# Patient Record
Sex: Female | Born: 1985 | Race: Black or African American | Hispanic: No | Smoking: Former smoker
Health system: Southern US, Community
[De-identification: ages and names within clinical notes are randomized; demographics above are authoritative.]

## PROBLEM LIST (undated history)

## (undated) DIAGNOSIS — I10 Essential (primary) hypertension: Secondary | ICD-10-CM

## (undated) DIAGNOSIS — K219 Gastro-esophageal reflux disease without esophagitis: Secondary | ICD-10-CM

## (undated) DIAGNOSIS — J302 Other seasonal allergic rhinitis: Secondary | ICD-10-CM

## (undated) HISTORY — DX: Gastro-esophageal reflux disease without esophagitis: K21.9

## (undated) HISTORY — DX: Other seasonal allergic rhinitis: J30.2

## (undated) HISTORY — PX: NO PAST SURGERIES: SHX2092

---
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
## 2007-03-24 ENCOUNTER — Observation Stay: Payer: Self-pay

## 2007-03-25 ENCOUNTER — Inpatient Hospital Stay: Payer: Self-pay | Admitting: Obstetrics and Gynecology

## 2008-11-01 IMAGING — US US OB US >=[ID] SNGL FETUS
1 series · 17 of 28 positions shown · non-contrast
Comparison: none

REASON FOR EXAM: abdominal pain no fetal movement pt in rm # 7
COMMENTS:

[Series 1: us ob us >=(id) sngl fetus · 17 of 59 slices shown]
[im 1/59]
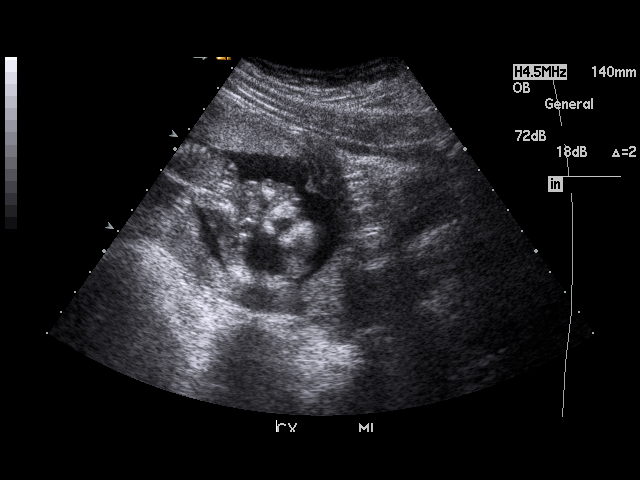
[im 5/59]
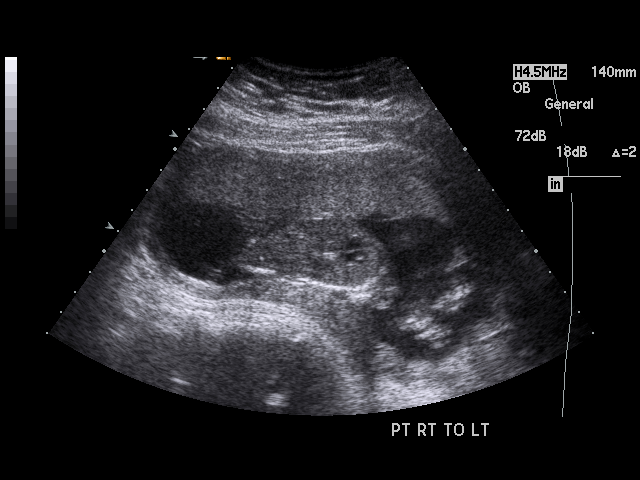
[im 9/59]
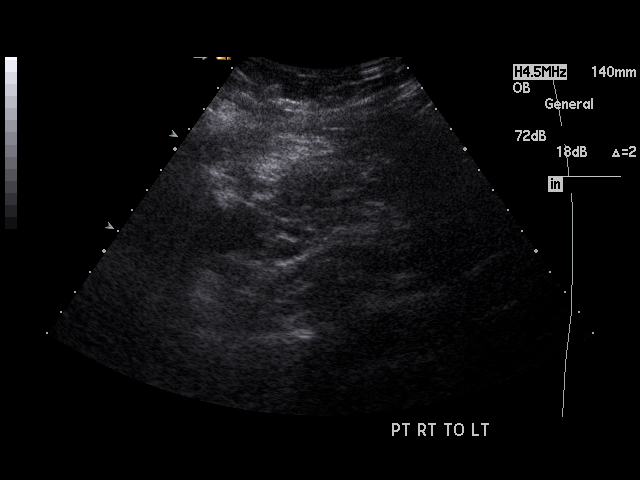
[im 11/59]
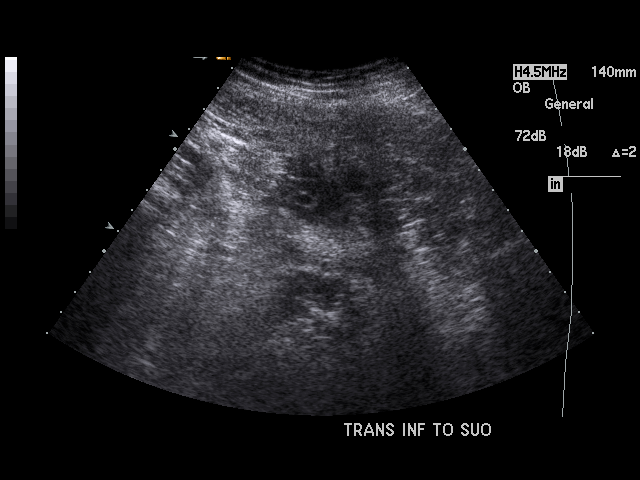
[im 16/59]
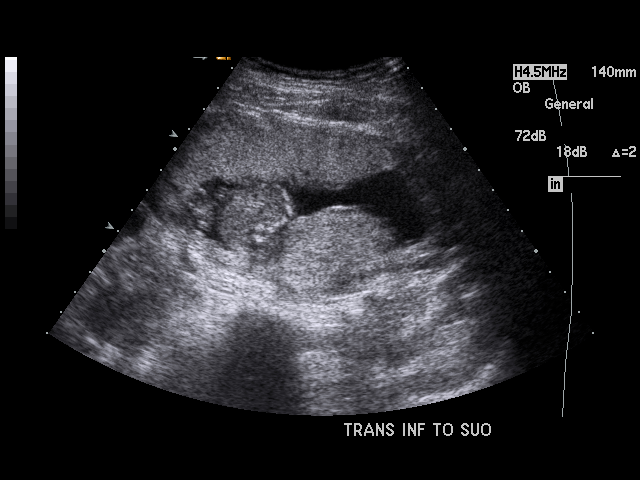
[im 20/59]
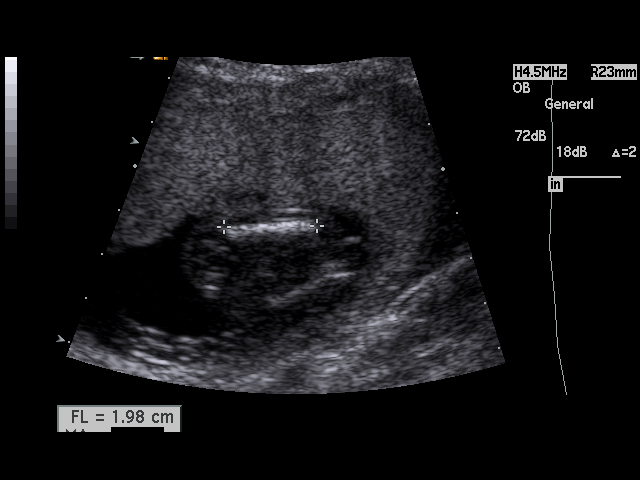
[im 22/59]
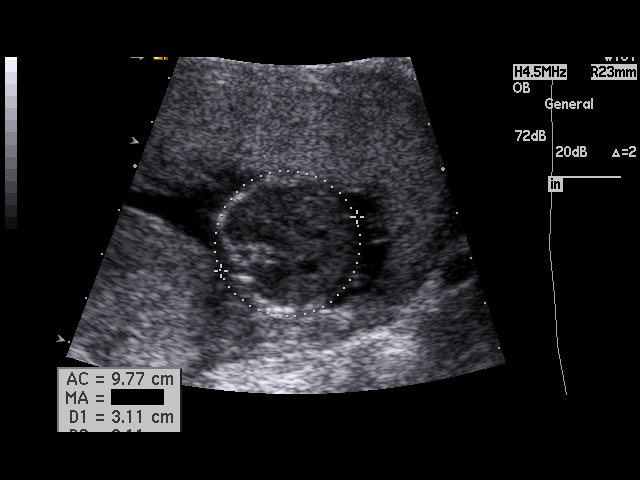
[im 26/59]
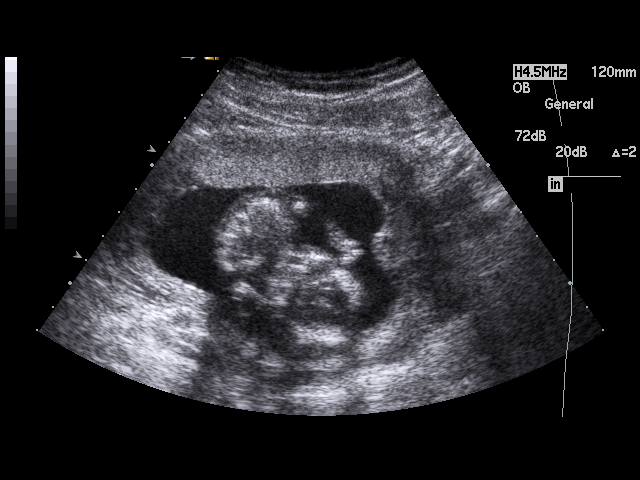
[im 31/59]
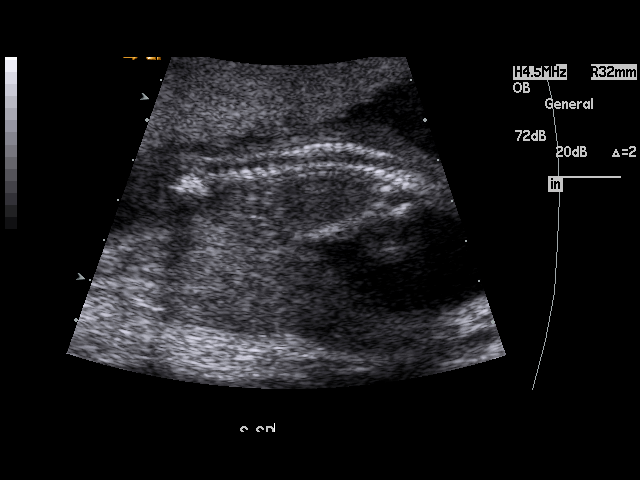
[im 33/59]
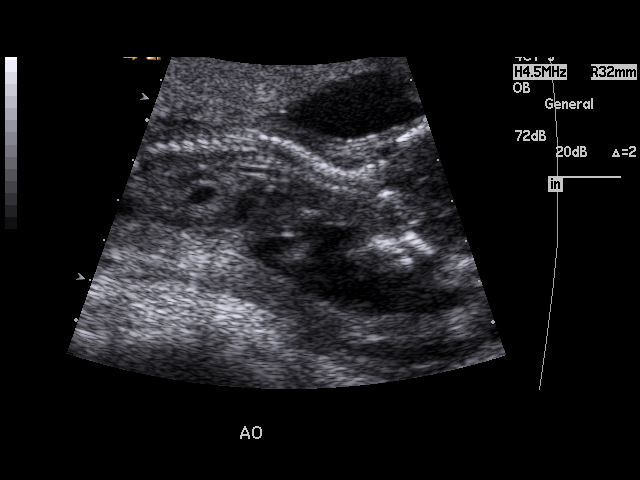
[im 37/59]
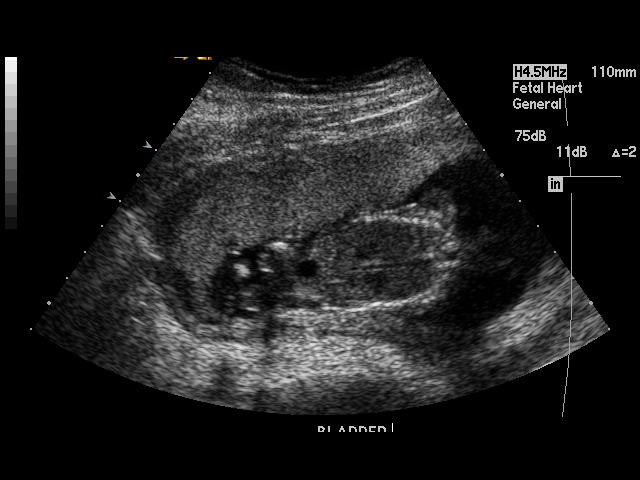
[im 39/59]
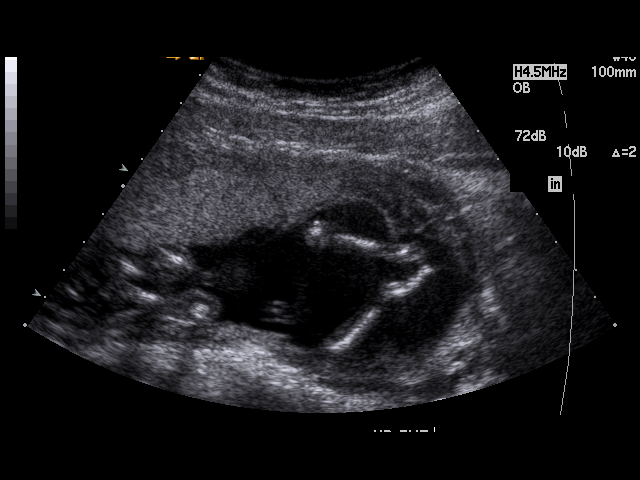
[im 43/59]
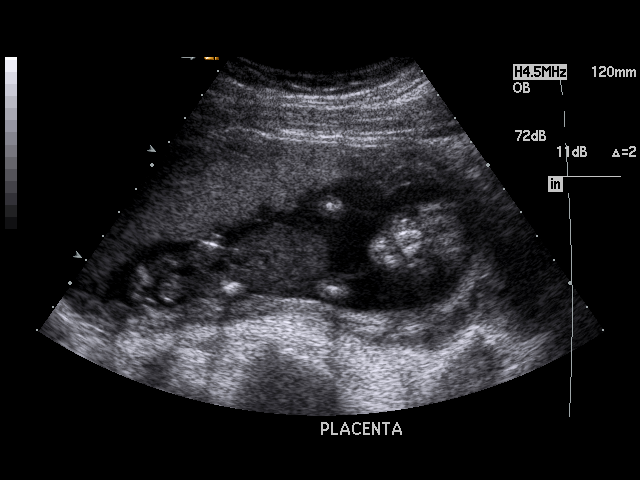
[im 48/59]
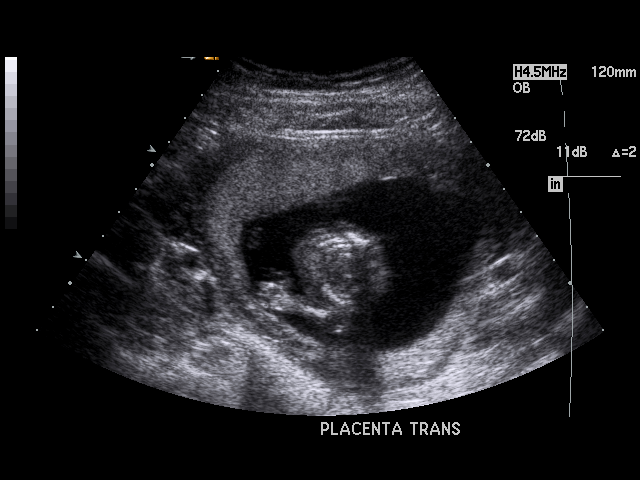
[im 50/59]
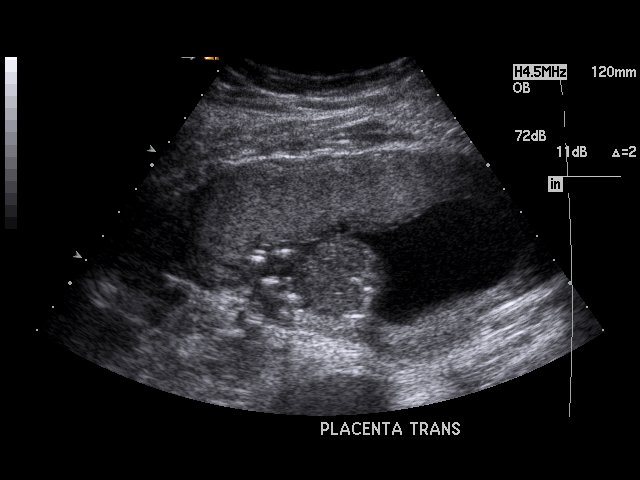
[im 54/59]
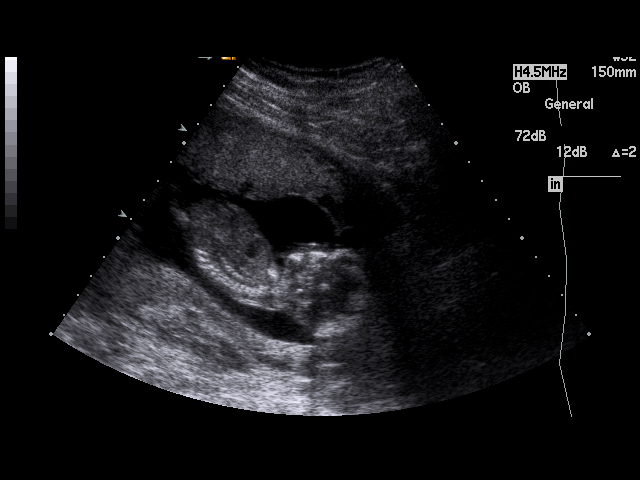
[im 59/59]
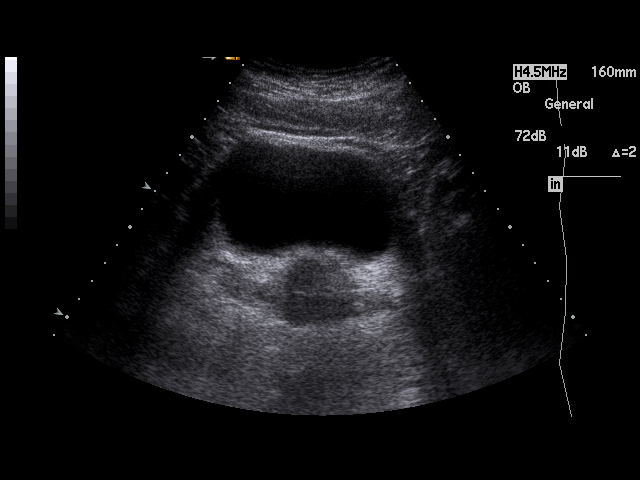

[17 of 28 positions shown; findings below may reference images not displayed]

PROCEDURE:     US  - US OB GREATER/OR EQUAL TO [id number]  - October 12, 2006  [DATE]

RESULT:     A single living intrauterine gestation is observed. Presentation
currently is cephalic. The placenta is anterior and high. Fetal heart rate
was monitored at 151 beats per minute. Fetal heart, stomach and urinary
bladder are visualized. No hydrocephalus or hydronephrosis is seen. Fetal
measurements are as follows:

BPD: 3.22 cm ([REDACTED] days)
AC: 9.83 cm (15 weeks-9 days)
FL: 2.00 cm ([REDACTED] days)

EFW = 158 grams. Average ultrasound age is [REDACTED]. Ultrasound EDD is
March 28, 2007.
IMPRESSION: 1. Single living intrauterine gestation of approximately [REDACTED]
gestational age.
2. Anterior placenta.
3. Amniotic fluid volume appears normal.

## 2018-08-24 ENCOUNTER — Other Ambulatory Visit: Payer: Self-pay

## 2018-08-24 ENCOUNTER — Encounter: Payer: Self-pay | Admitting: Certified Nurse Midwife

## 2018-08-24 ENCOUNTER — Ambulatory Visit (INDEPENDENT_AMBULATORY_CARE_PROVIDER_SITE_OTHER): Payer: BLUE CROSS/BLUE SHIELD | Admitting: Certified Nurse Midwife

## 2018-08-24 ENCOUNTER — Other Ambulatory Visit (HOSPITAL_COMMUNITY)
Admission: RE | Admit: 2018-08-24 | Discharge: 2018-08-24 | Disposition: A | Discharge status: Home / Self Care | Payer: BLUE CROSS/BLUE SHIELD | Source: Ambulatory Visit | Attending: Certified Nurse Midwife | Admitting: Certified Nurse Midwife

## 2018-08-24 VITALS — BP 116/76 | HR 88 | Ht 67.0 in | Wt 264.4 lb

## 2018-08-24 DIAGNOSIS — N898 Other specified noninflammatory disorders of vagina: Secondary | ICD-10-CM | POA: Insufficient documentation

## 2018-08-24 NOTE — Patient Instructions (Signed)
Vaginitis  Vaginitis is a condition in which the vaginal tissue swells and becomes red (inflamed). This condition is most often caused by a change in the normal balance of bacteria and yeast that live in the vagina. This change causes an overgrowth of certain bacteria or yeast, which causes the inflammation. There are different types of vaginitis, but the most common types are:   Bacterial vaginosis.   Yeast infection (candidiasis).   Trichomoniasis vaginitis. This is a sexually transmitted disease (STD).   Viral vaginitis.   Atrophic vaginitis.   Allergic vaginitis.  What are the causes?  The cause of this condition depends on the type of vaginitis. It can be caused by:   Bacteria (bacterial vaginosis).   Yeast, which is a fungus (yeast infection).   A parasite (trichomoniasis vaginitis).   A virus (viral vaginitis).   Low hormone levels (atrophic vaginitis). Low hormone levels can occur during pregnancy, breastfeeding, or after menopause.   Irritants, such as bubble baths, scented tampons, and feminine sprays (allergic vaginitis).  Other factors can change the normal balance of the yeast and bacteria that live in the vagina. These include:   Antibiotic medicines.   Poor hygiene.   Diaphragms, vaginal sponges, spermicides, birth control pills, and intrauterine devices (IUD).   Sex.   Infection.   Uncontrolled diabetes.   A weakened defense (immune) system.  What increases the risk?  This condition is more likely to develop in women who:   Smoke.   Use vaginal douches, scented tampons, or scented sanitary pads.   Wear tight-fitting pants.   Wear thong underwear.   Use oral birth control pills or an IUD.   Have sex without a condom.   Have multiple sex partners.   Have an STD.   Frequently use the spermicide nonoxynol-9.   Eat lots of foods high in sugar.   Have uncontrolled diabetes.   Have low estrogen levels.   Have a weakened immune system from an immune disorder or medical  treatment.   Are pregnant or breastfeeding.  What are the signs or symptoms?  Symptoms vary depending on the cause of the vaginitis. Common symptoms include:   Abnormal vaginal discharge.  ? The discharge is white, gray, or yellow with bacterial vaginosis.  ? The discharge is thick, white, and cheesy with a yeast infection.  ? The discharge is frothy and yellow or greenish with trichomoniasis.   A bad vaginal smell. The smell is fishy with bacterial vaginosis.   Vaginal itching, pain, or swelling.   Sex that is painful.   Pain or burning when urinating.  Sometimes there are no symptoms.  How is this diagnosed?  This condition is diagnosed based on your symptoms and medical history. A physical exam, including a pelvic exam, will also be done. You may also have other tests, including:   Tests to determine the pH level (acidity or alkalinity) of your vagina.   A whiff test, to assess the odor that results when a sample of your vaginal discharge is mixed with a potassium hydroxide solution.   Tests of vaginal fluid. A sample will be examined under a microscope.  How is this treated?  Treatment varies depending on the type of vaginitis you have. Your treatment may include:   Antibiotic creams or pills to treat bacterial vaginosis and trichomoniasis.   Antifungal medicines, such as vaginal creams or suppositories, to treat a yeast infection.   Medicine to ease discomfort if you have viral vaginitis. Your sexual partner   should also be treated.   Estrogen delivered in a cream, pill, suppository, or vaginal ring to treat atrophic vaginitis. If vaginal dryness occurs, lubricants and moisturizing creams may help. You may need to avoid scented soaps, sprays, or douches.   Stopping use of a product that is causing allergic vaginitis. Then using a vaginal cream to treat the symptoms.  Follow these instructions at home:  Lifestyle   Keep your genital area clean and dry. Avoid soap, and only rinse the area with  water.   Do not douche or use tampons until your health care provider says it is okay to do so. Use sanitary pads, if needed.   Do not have sex until your health care provider approves. When you can return to sex, practice safe sex and use condoms.   Wipe from front to back. This avoids the spread of bacteria from the rectum to the vagina.  General instructions   Take over-the-counter and prescription medicines only as told by your health care provider.   If you were prescribed an antibiotic medicine, take or use it as told by your health care provider. Do not stop taking or using the antibiotic even if you start to feel better.   Keep all follow-up visits as told by your health care provider. This is important.  How is this prevented?   Use mild, non-scented products. Do not use things that can irritate the vagina, such as fabric softeners. Avoid the following products if they are scented:  ? Feminine sprays.  ? Detergents.  ? Tampons.  ? Feminine hygiene products.  ? Soaps or bubble baths.   Let air reach your genital area.  ? Wear cotton underwear to reduce moisture buildup.  ? Avoid wearing underwear while you sleep.  ? Avoid wearing tight pants and underwear or nylons without a cotton panel.  ? Avoid wearing thong underwear.   Take off any wet clothing, such as bathing suits, as soon as possible.   Practice safe sex and use condoms.  Contact a health care provider if:   You have abdominal pain.   You have a fever.   You have symptoms that last for more than 2-3 days.  Get help right away if:   You have a fever and your symptoms suddenly get worse.  Summary   Vaginitis is a condition in which the vaginal tissue becomes inflamed.This condition is most often caused by a change in the normal balance of bacteria and yeast that live in the vagina.   Treatment varies depending on the type of vaginitis you have.   Do not douche, use tampons , or have sex until your health care provider approves. When  you can return to sex, practice safe sex and use condoms.  This information is not intended to replace advice given to you by your health care provider. Make sure you discuss any questions you have with your health care provider.  Document Released: 02/01/2007 Document Revised: 05/12/2016 Document Reviewed: 05/12/2016  Elsevier Interactive Patient Education  2019 [employer]

## 2018-08-24 NOTE — Progress Notes (Signed)
GYN ENCOUNTER NOTE  Subjective:       Brittney Lee is a 33 y.o. No obstetric history on file. female is here for gynecologic evaluation of the following issues:  1. History of recurrent BV. She states she noticed over the past year as her period frequency increased. She has also noticed after intercourse.     Gynecologic History Patient's last menstrual period was 07/25/2018 (approximate). Contraception: Nexplanon, 01/2016 Last Pap: 2017 or 2018. Results were: normal Last mammogram: n/a.   Obstetric History OB History  Gravida Para Term Preterm AB Living  2 2 2     2   SAB TAB Ectopic Multiple Live Births          2    # Outcome Date GA Lbr Len/2nd Weight Sex Delivery Anes PTL Lv  2 Term 2008   8 lb (3.629 kg) M Vag-Spont   LIV  1 Term 2005   7 lb (3.175 kg) F Vag-Spont   LIV    Past Medical History:  Diagnosis Date  . GERD (gastroesophageal reflux disease)   . Seasonal allergies     History reviewed. No pertinent surgical history.  Current Outpatient Medications on File Prior to Visit  Medication Sig Dispense Refill  . Cetirizine HCl 10 MG CAPS Take by mouth.    . fluticasone (FLONASE) 50 MCG/ACT nasal spray Place into the nose.    . ranitidine (ZANTAC) 150 MG tablet     . spironolactone (ALDACTONE) 50 MG tablet Take 50 mg by mouth daily.     No current facility-administered medications on file prior to visit.     No Known Allergies  Social History   Socioeconomic History  . Marital status: Single    Spouse name: Not on file  . Number of children: Not on file  . Years of education: Not on file  . Highest education level: Not on file  Occupational History  . Not on file  Social Needs  . Financial resource strain: Not on file  . Food insecurity:    Worry: Not on file    Inability: Not on file  . Transportation needs:    Medical: Not on file    Non-medical: Not on file  Tobacco Use  . Smoking status: Current Every Day Smoker  . Smokeless tobacco:  Never Used  Substance and Sexual Activity  . Alcohol use: Yes    Comment: occas  . Drug use: Not Currently  . Sexual activity: Yes    Birth control/protection: Implant  Lifestyle  . Physical activity:    Days per week: Not on file    Minutes per session: Not on file  . Stress: Not on file  Relationships  . Social connections:    Talks on phone: Not on file    Gets together: Not on file    Attends religious service: Not on file    Active member of club or organization: Not on file    Attends meetings of clubs or organizations: Not on file    Relationship status: Not on file  . Intimate partner violence:    Fear of current or ex partner: Not on file    Emotionally abused: Not on file    Physically abused: Not on file    Forced sexual activity: Not on file  Other Topics Concern  . Not on file  Social History Narrative  . Not on file    Family History  Problem Relation Age of Onset  . Diabetes Mother  The following portions of the patient's history were reviewed and updated as appropriate: allergies, current medications, past family history, past medical history, past social history, past surgical history and problem list.  Review of Systems Review of Systems - Negative except as mentioned in HPI Review of Systems - General ROS: negative for - chills, fatigue, fever, hot flashes, malaise or night sweats Hematological and Lymphatic ROS: negative for - bleeding problems or swollen lymph nodes Gastrointestinal ROS: negative for - abdominal pain, blood in stools, change in bowel habits and nausea/vomiting Musculoskeletal ROS: negative for - joint pain, muscle pain or muscular weakness Genito-Urinary ROS: negative for - change in menstrual cycle, dysmenorrhea, dyspareunia, dysuria, genital ulcers, hematuria, incontinence, irregular/heavy menses, nocturia or pelvic pain. Positive for increased vaginal infections.  Objective:   BP 116/76   Pulse 88   Ht 5\' 7"  (1.702 m)   Wt  264 lb 7 oz (119.9 kg)   LMP 07/25/2018 (Approximate)   BMI 41.42 kg/m  CONSTITUTIONAL: Well-developed, well-nourished, obese,female in no acute distress.  HENT:  Normocephalic, atraumatic.  NECK: Normal range of motion, supple  SKIN: Skin is warm and dry. No rash noted. Not diaphoretic. No erythema. No pallor. NEUROLGIC: Alert and oriented to person, place, and time.  PSYCHIATRIC: Normal mood and affect. Normal behavior. Normal judgment and thought content. CARDIOVASCULAR:Not Examined RESPIRATORY: Not Examined BREASTS: Not Examined ABDOMEN: Soft, non distended; Non tender.  No Organomegaly. PELVIC:  External Genitalia: Normal  BUS: Normal  Vagina: Normal, white thick discharge , no odor  Cervix: Normal MUSCULOSKELETAL: Normal range of motion. No tenderness.  No cyanosis, clubbing, or edema.   Assessment:   Increased Vaginal discharge Recurrent BV   Plan:   Vaginal swab today for BV, yeast, and STD per pt request. Discussed boric acid suppositories after intercourse and period or with antibiotic use. Pt verbalizes and agrees to plan. Discussed use of metronidazole gel for 4-6 months for recurrent BV. Will follow up with results. Return for annual exam prn.   Doreene BurkeAnnie Shontavia Mickel, CNM

## 2018-08-25 LAB — CERVICOVAGINAL ANCILLARY ONLY
Bacterial vaginitis: POSITIVE — AB
Candida vaginitis: NEGATIVE
Chlamydia: NEGATIVE
Neisseria Gonorrhea: NEGATIVE
Trichomonas: NEGATIVE

## 2018-08-26 ENCOUNTER — Other Ambulatory Visit: Payer: Self-pay | Admitting: Certified Nurse Midwife

## 2018-08-26 MED ORDER — METRONIDAZOLE 500 MG PO TABS: 500.0000 mg | ORAL_TABLET | Freq: Two times a day (BID) | ORAL | 0 refills | Status: AC

## 2018-08-26 NOTE — Progress Notes (Signed)
Swab positive for BV. Orders placed for treatment. Pt notified via my chart.   Doreene Burke, CNM

## 2020-03-25 LAB — OB RESULTS CONSOLE GC/CHLAMYDIA: Gonorrhea: NEGATIVE

## 2020-04-04 ENCOUNTER — Encounter: Payer: Self-pay | Admitting: Obstetrics and Gynecology

## 2020-04-04 ENCOUNTER — Ambulatory Visit (INDEPENDENT_AMBULATORY_CARE_PROVIDER_SITE_OTHER): Payer: Medicaid Other | Admitting: Obstetrics and Gynecology

## 2020-04-04 ENCOUNTER — Other Ambulatory Visit: Payer: Self-pay

## 2020-04-04 ENCOUNTER — Other Ambulatory Visit (HOSPITAL_COMMUNITY)
Admission: RE | Admit: 2020-04-04 | Discharge: 2020-04-04 | Disposition: A | Discharge status: Home / Self Care | Payer: BLUE CROSS/BLUE SHIELD | Source: Ambulatory Visit | Attending: Obstetrics and Gynecology | Admitting: Obstetrics and Gynecology

## 2020-04-04 ENCOUNTER — Other Ambulatory Visit: Payer: Self-pay | Admitting: Obstetrics and Gynecology

## 2020-04-04 VITALS — BP 140/84 | Wt 285.5 lb

## 2020-04-04 DIAGNOSIS — Z369 Encounter for antenatal screening, unspecified: Secondary | ICD-10-CM

## 2020-04-04 DIAGNOSIS — O0991 Supervision of high risk pregnancy, unspecified, first trimester: Secondary | ICD-10-CM

## 2020-04-04 DIAGNOSIS — Z113 Encounter for screening for infections with a predominantly sexual mode of transmission: Secondary | ICD-10-CM

## 2020-04-04 DIAGNOSIS — Z124 Encounter for screening for malignant neoplasm of cervix: Secondary | ICD-10-CM | POA: Diagnosis not present

## 2020-04-04 DIAGNOSIS — Z3A10 10 weeks gestation of pregnancy: Secondary | ICD-10-CM

## 2020-04-04 DIAGNOSIS — Z3689 Encounter for other specified antenatal screening: Secondary | ICD-10-CM

## 2020-04-04 DIAGNOSIS — O0990 Supervision of high risk pregnancy, unspecified, unspecified trimester: Secondary | ICD-10-CM

## 2020-04-04 LAB — POCT URINALYSIS DIPSTICK OB
Glucose, UA: NEGATIVE
POC,PROTEIN,UA: NEGATIVE

## 2020-04-04 LAB — OB RESULTS CONSOLE VARICELLA ZOSTER ANTIBODY, IGG: Varicella: IMMUNE

## 2020-04-04 LAB — POCT URINE PREGNANCY: Preg Test, Ur: POSITIVE — AB

## 2020-04-04 MED ORDER — AMOXICILLIN-POT CLAVULANATE 875-125 MG PO TABS: 1.0000 | ORAL_TABLET | Freq: Two times a day (BID) | ORAL | 0 refills | Status: AC

## 2020-04-04 NOTE — Progress Notes (Signed)
NOB - Need RX for sinus infection. RM 3

## 2020-04-04 NOTE — Progress Notes (Signed)
New Obstetric Patient H&P    Chief Complaint: "Desires prenatal care"   History of Present Illness: Patient is a 34 y.o. [id number] Not Hispanic or Latino female, presents with amenorrhea and positive home pregnancy test. Patient's last menstrual period was 01/24/2020 (exact date). and based on her  LMP, her EDD is Estimated Date of Delivery: 10/30/20 and her EGA is [redacted]w[redacted]d. Cycles are regular monthly.   Since her LMP she claims she has experienced some fatigue, breast tenderness, no significant nausea. She denies vaginal bleeding. Her past medical history is noncontributory. Her prior pregnancies are notable for none  Since her LMP, she admits to the use of tobacco products  no There are cats in the home in the home  yes If yes Indoor She admits close contact with children on a regular basis  yes (15 year old son 43 year old daughter) She has had chicken pox in the past yes She has had Tuberculosis exposures, symptoms, or previously tested positive for TB   no Current or past history of domestic violence. no  Genetic Screening/Teratology Counseling: (Includes patient, baby's father, or anyone in either family with:)   1. Patient's age >/= 83 at [facility]  no 2. Thalassemia ([place], [place], Mediterranean, or Asian background): MCV<80  no 3. Neural tube defect (meningomyelocele, spina bifida, anencephaly)  no 4. Congenital heart defect  no  5. Down syndrome  no 6. Tay-Sachs (Jewish, [place]  no 7. Canavan's Disease  no 8. Sickle cell disease or trait (African)  no  9. Hemophilia or other blood disorders  no  10. Muscular dystrophy  no  11. Cystic fibrosis  no  12. Huntington's Chorea  no  13. Mental retardation/autism  no 14. Other inherited genetic or chromosomal disorder  no 15. Maternal metabolic disorder (DM, PKU, etc)  no 16. Patient or FOB with a child with a birth defect not listed above no  16a. Patient or FOB with a birth defect themselves no 17. Recurrent pregnancy loss,  or stillbirth  no  18. Any medications since LMP other than prenatal vitamins (include vitamins, supplements, OTC meds, drugs, alcohol)  no 19. Any other genetic/environmental exposure to discuss  no  Infection History:   1. Lives with someone with TB or TB exposed  no  2. Patient or partner has history of genital herpes  no 3. Rash or viral illness since LMP  no 4. History of STI (GC, CT, HPV, syphilis, HIV)  yes 5. History of recent travel :  no  Other pertinent information:  no     Review of Systems:10 point review of systems negative unless otherwise noted in HPI  Past Medical History:  There are no problems to display for this patient.   Past Surgical History:  No past surgical history on file.  Gynecologic History: Patient's last menstrual period was 01/24/2020 (exact date).  Obstetric History: [id number]  Family History:  Family History  Problem Relation Age of Onset  . Diabetes Mother     Social History:  Social History   Socioeconomic History  . Marital status: Single    Spouse name: Not on file  . Number of children: Not on file  . Years of education: Not on file  . Highest education level: Not on file  Occupational History  . Not on file  Tobacco Use  . Smoking status: Current Every Day Smoker  . Smokeless tobacco: Never Used  Substance and Sexual Activity  . Alcohol use: Yes  Comment: occas  . Drug use: Not Currently  . Sexual activity: Yes    Birth control/protection: Implant  Other Topics Concern  . Not on file  Social History Narrative  . Not on file   Social Determinants of Health   Financial Resource Strain: Not on file  Food Insecurity: Not on file  Transportation Needs: Not on file  Physical Activity: Not on file  Stress: Not on file  Social Connections: Not on file  Intimate Partner Violence: Not on file    Allergies:  No Known Allergies  Medications: Prior to Admission medications   Medication Sig Start Date End Date  Taking? Authorizing Provider  Cetirizine HCl 10 MG CAPS Take by mouth.   Yes [provider]  clindamycin (CLEOCIN T) 1 % lotion Apply topically to acne lesions in morning 03/27/20  Yes [provider]  fluticasone (FLONASE) 50 MCG/ACT nasal spray Place into the nose.   Yes [provider]  Prenatal Vit-Fe Fumarate-FA (PREPLUS) 27-1 MG TABS  01/16/20  Yes [provider]  ranitidine (ZANTAC) 150 MG tablet  07/29/17   [provider]  spironolactone (ALDACTONE) 50 MG tablet Take 50 mg by mouth daily.    [provider]    Physical Exam Vitals: Blood pressure 140/84, weight 285 lb 8 oz (129.5 kg), last menstrual period 01/24/2020. Body mass index is 44.72 kg/m.  General: NAD HEENT: normocephalic, anicteric Thyroid: no enlargement, no palpable nodules Pulmonary: No increased work of breathing, CTAB Cardiovascular: RRR, distal pulses 2+ Abdomen: NABS, soft, non-tender, non-distended.  Umbilicus without lesions.  No hepatomegaly, splenomegaly or masses palpable. No evidence of hernia  Genitourinary:  External: Normal external female genitalia.  Normal urethral meatus, normal  Bartholin's and Skene's glands.    Vagina: Normal vaginal mucosa, no evidence of prolapse.    Cervix: Grossly normal in appearance, no bleeding  Uterus: 10 week size, mobile, normal contour.  No CMT  Adnexa: ovaries non-enlarged, no adnexal masses  Rectal: deferred Extremities: no edema, erythema, or tenderness Neurologic: Grossly intact Psychiatric: mood appropriate, affect full   Assessment: 34 y.o. G3P2002 at [redacted]w[redacted]d presenting to initiate prenatal care  Plan: 1) Avoid alcoholic beverages. 2) Patient encouraged not to smoke.  3) Discontinue the use of all non-medicinal drugs and chemicals.  4) Take prenatal vitamins daily.  5) Nutrition, food safety (fish, cheese advisories, and high nitrite foods) and exercise discussed. 6) Hospital and practice style  discussed with cross coverage system.  7) Sinusitis - covid vaccinated, Rx augmentin 8) Dating can ordered  Vena [place], MD, [facility], [facility] 04/04/2020, 3:44 PM

## 2020-04-06 LAB — RPR+RH+ABO+RUB AB+AB SCR+CB...
Antibody Screen: NEGATIVE
HIV Screen 4th Generation wRfx: NONREACTIVE
Hematocrit: 38.4 % (ref 34.0–46.6)
Hemoglobin: 12.9 g/dL (ref 11.1–15.9)
Hepatitis B Surface Ag: NEGATIVE
MCH: 27.7 pg (ref 26.6–33.0)
MCHC: 33.6 g/dL (ref 31.5–35.7)
MCV: 82 fL (ref 79–97)
Platelets: 349 10*3/uL (ref 150–450)
RBC: 4.66 x10E6/uL (ref 3.77–5.28)
RDW: 14 % (ref 11.7–15.4)
RPR Ser Ql: NONREACTIVE
Rh Factor: POSITIVE
Rubella Antibodies, IGG: 14.5 index (ref >0.99)
Varicella zoster IgG: >4000 index (ref >165)
WBC: 7.4 10*3/uL (ref 3.4–10.8)

## 2020-04-07 LAB — URINE CULTURE

## 2020-04-09 LAB — CYTOLOGY - PAP
Chlamydia: NEGATIVE
Comment: NEGATIVE
Comment: NEGATIVE
Comment: NORMAL
Diagnosis: UNDETERMINED — AB
High risk HPV: NEGATIVE
Neisseria Gonorrhea: NEGATIVE

## 2020-04-20 NOTE — L&D Delivery Note (Signed)
MD Bjorn Pippin at bedside to talk at length with pt about options. Pt given several options by MD, and pt decided to go home and take 60 mg of her BP medication. Pt aware of all risks and benefits at this time and pt being DC ambulatory with steady gait.

## 2020-04-22 ENCOUNTER — Ambulatory Visit (INDEPENDENT_AMBULATORY_CARE_PROVIDER_SITE_OTHER): Payer: BC Managed Care – PPO

## 2020-04-22 ENCOUNTER — Other Ambulatory Visit: Payer: Self-pay

## 2020-04-22 ENCOUNTER — Ambulatory Visit (INDEPENDENT_AMBULATORY_CARE_PROVIDER_SITE_OTHER): Payer: Self-pay | Admitting: Obstetrics and Gynecology

## 2020-04-22 VITALS — BP 126/82 | Wt 284.0 lb

## 2020-04-22 DIAGNOSIS — Z3A12 12 weeks gestation of pregnancy: Secondary | ICD-10-CM | POA: Diagnosis not present

## 2020-04-22 DIAGNOSIS — Z3689 Encounter for other specified antenatal screening: Secondary | ICD-10-CM

## 2020-04-22 DIAGNOSIS — O0991 Supervision of high risk pregnancy, unspecified, first trimester: Secondary | ICD-10-CM

## 2020-04-22 DIAGNOSIS — O99210 Obesity complicating pregnancy, unspecified trimester: Secondary | ICD-10-CM

## 2020-04-22 DIAGNOSIS — Z369 Encounter for antenatal screening, unspecified: Secondary | ICD-10-CM

## 2020-04-22 DIAGNOSIS — Z31438 Encounter for other genetic testing of female for procreative management: Secondary | ICD-10-CM

## 2020-04-22 DIAGNOSIS — O0990 Supervision of high risk pregnancy, unspecified, unspecified trimester: Secondary | ICD-10-CM

## 2020-04-22 DIAGNOSIS — Z1379 Encounter for other screening for genetic and chromosomal anomalies: Secondary | ICD-10-CM

## 2020-04-22 MED ORDER — TERCONAZOLE 0.4 % VA CREA: 1.0000 | TOPICAL_CREAM | Freq: Every day | VAGINAL | 1 refills | Status: DC

## 2020-04-22 NOTE — Progress Notes (Signed)
Routine Prenatal Care Visit  Subjective  Brittney Lee is a 35 y.o. G3P2002 at [redacted]w[redacted]d being seen today for ongoing prenatal care.  She is currently monitored for the following issues for this high-risk pregnancy and has Supervision of high risk pregnancy, antepartum and Maternal obesity, antepartum on their problem list.  ----------------------------------------------------------------------------------- Patient reports no complaints.  Did have stomach flu about a week ago 24-hrs.  Sinusitis improved with Augmentin but candida symptoms now Contractions: Not present. Vag. Bleeding: None.  Movement: Absent. Denies leaking of fluid.  ----------------------------------------------------------------------------------- The following portions of the patient's history were reviewed and updated as appropriate: allergies, current medications, past family history, past medical history, past social history, past surgical history and problem list. Problem list updated.   Objective  Blood pressure 126/82, weight 284 lb (128.8 kg), last menstrual period 01/24/2020. Pregravid weight 273 lb (123.8 kg) Total Weight Gain 11 lb (4.99 kg) Urinalysis:      Fetal Status: Fetal Heart Rate (bpm): 171   Movement: Absent     General:  Alert, oriented and cooperative. Patient is in no acute distress.  Skin: Skin is warm and dry. No rash noted.   Cardiovascular: Normal heart rate noted  Respiratory: Normal respiratory effort, no problems with respiration noted  Abdomen: Soft, gravid, appropriate for gestational age. Pain/Pressure: Absent     Pelvic:  Cervical exam deferred        Extremities: Normal range of motion.     ental Status: Normal mood and affect. Normal behavior. Normal judgment and thought content.   US OB Comp Less 14 Wks  Result Date: 04/22/2020 Patient Name: Brittney Lee DOB: March 03, 1986 MRN: [id number] ULTRASOUND REPORT Location: [facility] Date of Service: 04/22/2020 Indications:dating  Findings: Mason Jim intrauterine pregnancy is visualized with a CRL consistent with [redacted]w[redacted]d gestation, giving an (U/S) EDD of 10/29/2020. The (U/S) EDD is consistent with the clinically established EDD of 10/30/2020. FHR: 171 BPM CRL measurement: 65.1 mm Yolk sac is questionably visualized. Amnion: visualized and appears normal Right Ovary is normal in appearance. Left Ovary is normal appearance. Corpus luteal cyst:  Right ovary Survey of the adnexa demonstrates no adnexal masses. There is no free peritoneal fluid in the cul de sac. Impression: 1. [redacted]w[redacted]d Viable Singleton Intrauterine pregnancy by U/S. 2. (U/S) EDD is consistent with Clinically established EDD of 10/30/2020, [redacted]w[redacted]d. Recommendations: 1.Clinical correlation with the patient's History and Physical Exam. Deanna Artis, RT There is a viable singleton gestation.  The fetal biometry correlates with established dating. Detailed evaluation of the fetal anatomy is precluded by early gestational age.  It must be noted that a normal ultrasound particular at this early gestational age is unable to rule out fetal aneuploidy, risk of first trimester miscarriage, or anatomic birth defects. Vena [place], MD, [facility], [facility] 04/22/2020, 3:21 PM     Assessment   34 y.o. G3O7564 at [redacted]w[redacted]d by  10/30/2020, by Last Menstrual Period presenting for routine prenatal visit  Plan   pregnancy 3 Problems (from 04/04/20 to present)    Problem Noted Resolved   Supervision of high risk pregnancy, antepartum 04/22/2020 by Vena [place], MD No   Overview Signed 04/22/2020  4:05 PM by Vena [place], MD    [facility]inic [facility] Prenatal Labs  Dating LMP = 12 week [place] Blood type: AB/Positive/-- (12/16 1637)   Genetic Screen 1 Screen:    AFP:     Quad:     NIPS: Antibody:Negative (12/16 1637)  Anatomic [place]  Rubella: 14.50 (12/16 1637) Varicella: Immune  GTT Early:               Third trimester:  RPR: Non Reactive (12/16 1637)   Rhogam N/A  HBsAg: Negative (12/16 1637)   TDaP vaccine                       Flu Shot: HIV: Non Reactive (12/16 1637)   Baby Food                                GBS:   Contraception  Pap: ASCUS HPV negative 04/04/2020  CBB     CS/VBAC    Support Person                Gestational age appropriate obstetric precautions including but not limited to vaginal bleeding, contractions, leaking of fluid and fetal movement were reviewed in detail with the patient.    Return in about 4 weeks (around 05/20/2020) for ROB and early 1-hr glucose.  Vena [place], MD, [facility], [facility] 04/22/2020, 4:07 PM

## 2020-04-27 LAB — MATERNIT 21 PLUS CORE, BLOOD
Fetal Fraction: 8
Result (T21): NEGATIVE
Trisomy 13 (Patau syndrome): NEGATIVE
Trisomy 18 (Edwards syndrome): NEGATIVE
Trisomy 21 (Down syndrome): NEGATIVE

## 2020-05-03 LAB — INHERITEST CORE(CF97,SMA,FRAX)

## 2020-05-09 ENCOUNTER — Other Ambulatory Visit: Payer: Self-pay

## 2020-05-09 DIAGNOSIS — O0990 Supervision of high risk pregnancy, unspecified, unspecified trimester: Secondary | ICD-10-CM

## 2020-05-09 DIAGNOSIS — O99210 Obesity complicating pregnancy, unspecified trimester: Secondary | ICD-10-CM

## 2020-05-21 ENCOUNTER — Other Ambulatory Visit: Payer: Self-pay

## 2020-05-21 ENCOUNTER — Ambulatory Visit (INDEPENDENT_AMBULATORY_CARE_PROVIDER_SITE_OTHER): Payer: Medicaid Other | Admitting: Advanced Practice Midwife

## 2020-05-21 ENCOUNTER — Other Ambulatory Visit: Payer: Medicaid Other

## 2020-05-21 ENCOUNTER — Encounter: Payer: Self-pay | Admitting: Advanced Practice Midwife

## 2020-05-21 VITALS — BP 128/80 | Wt 284.0 lb

## 2020-05-21 DIAGNOSIS — O99210 Obesity complicating pregnancy, unspecified trimester: Secondary | ICD-10-CM

## 2020-05-21 DIAGNOSIS — O0992 Supervision of high risk pregnancy, unspecified, second trimester: Secondary | ICD-10-CM

## 2020-05-21 DIAGNOSIS — Z3A16 16 weeks gestation of pregnancy: Secondary | ICD-10-CM

## 2020-05-21 NOTE — Progress Notes (Signed)
  Routine Prenatal Care Visit  Subjective  JAYDEN RUDGE is a 35 y.o. G3P2002 at [redacted]w[redacted]d being seen today for ongoing prenatal care.  She is currently monitored for the following issues for this high-risk pregnancy and has Supervision of high risk pregnancy, antepartum and Maternal obesity, antepartum on their problem list.  ----------------------------------------------------------------------------------- Patient reports no complaints.  States she didn't know she was having a glucose test today and she ate a fast food burger before getting here.  Contractions: Not present. Vag. Bleeding: None.  Movement: Present. Leaking Fluid denies.  ----------------------------------------------------------------------------------- The following portions of the patient's history were reviewed and updated as appropriate: allergies, current medications, past family history, past medical history, past social history, past surgical history and problem list. Problem list updated.  Objective  Blood pressure 128/80, weight 284 lb (128.8 kg), last menstrual period 01/24/2020. Pregravid weight 273 lb (123.8 kg) Total Weight Gain 11 lb (4.99 kg) Urinalysis: Urine Protein    Urine Glucose    Fetal Status: Fetal Heart Rate (bpm): 145   Movement: Present     General:  Alert, oriented and cooperative. Patient is in no acute distress.  Skin: Skin is warm and dry. No rash noted.   Cardiovascular: Normal heart rate noted  Respiratory: Normal respiratory effort, no problems with respiration noted  Abdomen: Soft, gravid, appropriate for gestational age. Pain/Pressure: Absent     Pelvic:  Cervical exam deferred        Extremities: Normal range of motion.     Mental Status: Normal mood and affect. Normal behavior. Normal judgment and thought content.   Assessment   35 y.o. [id number] at [redacted]w[redacted]d by  10/30/2020, by Last Menstrual Period presenting for routine prenatal visit  Plan   pregnancy 3 Problems (from 04/04/20 to  present)    Problem Noted Resolved   Supervision of high risk pregnancy, antepartum 04/22/2020 by Vena Austria, MD No   Overview Addendum 05/07/2020  8:54 AM by Vena Austria, MD    Clinic [facility] Prenatal Labs  Dating LMP = 12 week [place] Blood type: AB/Positive/-- (12/16 1637)   Genetic Screen NIPS: Normal XY Inheritest:Neg SMA, neg CF, neg Fragile-X Antibody:Negative (12/16 1637)  Anatomic [place]  Rubella: 14.50 (12/16 1637) Varicella: Immune  GTT Early:               Third trimester:  RPR: Non Reactive (12/16 1637)   Rhogam N/A HBsAg: Negative (12/16 1637)   TDaP vaccine                       Flu Shot: HIV: Non Reactive (12/16 1637)   Baby Food                                GBS:   Contraception  Pap: ASCUS HPV negative 04/04/2020  CBB   Pelvis tested to 8lbs  CS/VBAC N/A   Support Person            Previous Version       Preterm labor symptoms and general obstetric precautions including but not limited to vaginal bleeding, contractions, leaking of fluid and fetal movement were reviewed in detail with the patient. Weight gain during pregnancy: discussed healthy diet with increased physical activity for optimum weight gain.   Return in about 4 weeks (around 06/18/2020) for anatomy scan and rob.  Tresea Mall, CNM 05/21/2020 3:20 PM

## 2020-05-22 LAB — GLUCOSE TOLERANCE, 1 HOUR: Glucose, 1Hr PP: 173 mg/dL (ref 65–199)

## 2020-05-28 ENCOUNTER — Other Ambulatory Visit: Payer: Self-pay | Admitting: Obstetrics and Gynecology

## 2020-05-28 DIAGNOSIS — O0990 Supervision of high risk pregnancy, unspecified, unspecified trimester: Secondary | ICD-10-CM

## 2020-05-28 DIAGNOSIS — O99810 Abnormal glucose complicating pregnancy: Secondary | ICD-10-CM

## 2020-05-28 DIAGNOSIS — O99210 Obesity complicating pregnancy, unspecified trimester: Secondary | ICD-10-CM

## 2020-05-28 DIAGNOSIS — Z369 Encounter for antenatal screening, unspecified: Secondary | ICD-10-CM

## 2020-05-28 NOTE — Progress Notes (Signed)
Need 1-hr OGTT test in the next week order is in patient is aware

## 2020-06-05 ENCOUNTER — Other Ambulatory Visit: Payer: Medicaid Other

## 2020-06-05 ENCOUNTER — Other Ambulatory Visit: Payer: Self-pay

## 2020-06-05 DIAGNOSIS — O0990 Supervision of high risk pregnancy, unspecified, unspecified trimester: Secondary | ICD-10-CM

## 2020-06-05 DIAGNOSIS — Z369 Encounter for antenatal screening, unspecified: Secondary | ICD-10-CM

## 2020-06-05 DIAGNOSIS — O99210 Obesity complicating pregnancy, unspecified trimester: Secondary | ICD-10-CM

## 2020-06-05 DIAGNOSIS — O99810 Abnormal glucose complicating pregnancy: Secondary | ICD-10-CM

## 2020-06-06 LAB — GLUCOSE TOLERANCE, 1 HOUR: Glucose, 1Hr PP: 155 mg/dL (ref 65–199)

## 2020-06-13 ENCOUNTER — Other Ambulatory Visit: Payer: Self-pay | Admitting: Obstetrics and Gynecology

## 2020-06-13 ENCOUNTER — Telehealth: Payer: Self-pay

## 2020-06-13 DIAGNOSIS — O0990 Supervision of high risk pregnancy, unspecified, unspecified trimester: Secondary | ICD-10-CM

## 2020-06-13 DIAGNOSIS — O99210 Obesity complicating pregnancy, unspecified trimester: Secondary | ICD-10-CM

## 2020-06-13 DIAGNOSIS — O99810 Abnormal glucose complicating pregnancy: Secondary | ICD-10-CM

## 2020-06-13 NOTE — Telephone Encounter (Signed)
-----   Message from Vena Austria, MD sent at 06/13/2020 12:39 PM EST ----- Needs 3-hr OGTT if we can do it same time as her follow up visit next week and move her follow up to the morning.  Order is in patient aware

## 2020-06-13 NOTE — Progress Notes (Signed)
Needs 3-hr OGTT if we can do it same time as her follow up visit next week and move her follow up to the morning.  Order is in patient aware

## 2020-06-13 NOTE — Progress Notes (Signed)
1-hr still elevated discussed need to proceed with 3-hr OGTT with patient.

## 2020-06-13 NOTE — Telephone Encounter (Signed)
I called to schedule appointment for lab, I offered our next opening on 06/21/20. Patient said she would have to call back to scheduled due to work. Patient refused to scheduled at this time

## 2020-06-18 ENCOUNTER — Other Ambulatory Visit: Payer: Self-pay

## 2020-06-18 ENCOUNTER — Telehealth: Payer: Self-pay

## 2020-06-18 ENCOUNTER — Other Ambulatory Visit: Payer: Medicaid Other

## 2020-06-18 DIAGNOSIS — O99210 Obesity complicating pregnancy, unspecified trimester: Secondary | ICD-10-CM

## 2020-06-18 DIAGNOSIS — O0990 Supervision of high risk pregnancy, unspecified, unspecified trimester: Secondary | ICD-10-CM

## 2020-06-18 DIAGNOSIS — O99810 Abnormal glucose complicating pregnancy: Secondary | ICD-10-CM

## 2020-06-18 NOTE — Telephone Encounter (Signed)
Patient was scheduled today for 3 Hour Glucose.There was a failure of communication per patient when instructed to come back after drinking her glucose drink so the first draw wasn't able drawn correctly. .  Patient stated that she didn't want to retake to 3 hour glucose sent this is her third time having to take it. Patient is scheduled for u/s and follow up with you 06/19/20

## 2020-06-18 NOTE — Telephone Encounter (Signed)
Please advise 

## 2020-06-19 ENCOUNTER — Ambulatory Visit (INDEPENDENT_AMBULATORY_CARE_PROVIDER_SITE_OTHER): Payer: Medicaid Other

## 2020-06-19 ENCOUNTER — Encounter: Payer: Self-pay | Admitting: Advanced Practice Midwife

## 2020-06-19 ENCOUNTER — Ambulatory Visit (INDEPENDENT_AMBULATORY_CARE_PROVIDER_SITE_OTHER): Payer: BC Managed Care – PPO | Admitting: Advanced Practice Midwife

## 2020-06-19 VITALS — BP 126/84 | Wt 288.0 lb

## 2020-06-19 DIAGNOSIS — Z3A20 20 weeks gestation of pregnancy: Secondary | ICD-10-CM | POA: Diagnosis not present

## 2020-06-19 DIAGNOSIS — O99210 Obesity complicating pregnancy, unspecified trimester: Secondary | ICD-10-CM

## 2020-06-19 DIAGNOSIS — O0992 Supervision of high risk pregnancy, unspecified, second trimester: Secondary | ICD-10-CM

## 2020-06-19 DIAGNOSIS — Z3A21 21 weeks gestation of pregnancy: Secondary | ICD-10-CM

## 2020-06-19 DIAGNOSIS — Z369 Encounter for antenatal screening, unspecified: Secondary | ICD-10-CM

## 2020-06-19 NOTE — Progress Notes (Signed)
Anatomy scan today. No vb. No lof.  ?

## 2020-06-19 NOTE — Progress Notes (Signed)
Routine Prenatal Care Visit  Subjective  Brittney Lee is a 35 y.o. G3P2002 at [redacted]w[redacted]d being seen today for ongoing prenatal care.  She is currently monitored for the following issues for this high-risk pregnancy and has Supervision of high risk pregnancy, antepartum and Maternal obesity, antepartum on their problem list.  ----------------------------------------------------------------------------------- Patient reports no complaints.   Contractions: Not present. Vag. Bleeding: None.  Movement: Present. Leaking Fluid denies.  ----------------------------------------------------------------------------------- The following portions of the patient's history were reviewed and updated as appropriate: allergies, current medications, past family history, past medical history, past social history, past surgical history and problem list. Problem list updated.  Objective  Blood pressure 126/84, weight 288 lb (130.6 kg), last menstrual period 01/24/2020. Pregravid weight 273 lb (123.8 kg) Total Weight Gain 15 lb (6.804 kg) Urinalysis: Urine Protein    Urine Glucose    Fetal Status: Fetal Heart Rate (bpm): 159   Movement: Present      Patient Name: Brittney Lee DOB: 02-Mar-1986 MRN: [id number]  ULTRASOUND REPORT  Location: [facility] Date of Service: 06/19/2020   Indications:Anatomy Ultrasound Findings:  Mason Jim intrauterine pregnancy is visualized with FHR at 148 BPM.   Biometrics give an (U/S) Gestational age of [redacted]w[redacted]d and an (U/S) EDD of 10/31/20; this correlates with the clinically established Estimated Date of Delivery: 10/30/20   Fetal presentation is transverse with head maternal left. EFW: 374g (13oz). Placenta: anterior, unable to evaluate for previa due to contraction. Grade: 1 Cervix: unable to measure length due to contraction.  AFI: subjectively normal.  Anatomic survey is incomplete for spine due to transverse position; Gender - female.    Right Ovary: is  normal in appearance. Left Ovary: is normal appearance. Survey of the adnexa demonstrates no adnexal masses.  There is no free peritoneal fluid in the cul de sac.  Impression: 1. [redacted]w[redacted]d Viable Singleton Intrauterine pregnancy by U/S. 2. (U/S) EDD is consistent with Clinically established Estimated Date of Delivery: 10/30/20 . 3. Follow up cervical length with placenta location and spine views.  Recommendations: 1.Clinical correlation with the patient's History and Physical Exam.  Darlina Guys, RT   General:  Alert, oriented and cooperative. Patient is in no acute distress.  Skin: Skin is warm and dry. No rash noted.   Cardiovascular: Normal heart rate noted  Respiratory: Normal respiratory effort, no problems with respiration noted  Abdomen: Soft, gravid, appropriate for gestational age. Pain/Pressure: Absent     Pelvic:  Cervical exam deferred        Extremities: Normal range of motion.     Mental Status: Normal mood and affect. Normal behavior. Normal judgment and thought content.   Assessment   35 y.o. [id number] at [redacted]w[redacted]d by  10/30/2020, by Last Menstrual Period presenting for routine prenatal visit  Plan   pregnancy 3 Problems (from 04/04/20 to present)    Problem Noted Resolved   Supervision of high risk pregnancy, antepartum 04/22/2020 by Vena Austria, MD No   Overview Addendum 05/07/2020  8:54 AM by Vena Austria, MD    Clinic [facility] Prenatal Labs  Dating LMP = 12 week [place] Blood type: AB/Positive/-- (12/16 1637)   Genetic Screen NIPS: Normal XY Inheritest:Neg SMA, neg CF, neg Fragile-X Antibody:Negative (12/16 1637)  Anatomic [place]  Rubella: 14.50 (12/16 1637) Varicella: Immune  GTT Early:               Third trimester:  RPR: Non Reactive (12/16 1637)   Rhogam N/A HBsAg: Negative (12/16 1637)   TDaP vaccine  Flu Shot: HIV: Non Reactive (12/16 1637)   Baby Food                                GBS:   Contraception  Pap: ASCUS HPV negative  04/04/2020  CBB   Pelvis tested to 8lbs  CS/VBAC N/A   Support Person            Previous Version       Preterm labor symptoms and general obstetric precautions including but not limited to vaginal bleeding, contractions, leaking of fluid and fetal movement were reviewed in detail with the patient.   Return in about 4 weeks (around 07/17/2020) for follow up anatomy scan and rob.  Tresea Mall, CNM 06/19/2020 5:10 PM

## 2020-06-20 ENCOUNTER — Other Ambulatory Visit: Payer: Self-pay | Admitting: Advanced Practice Midwife

## 2020-06-20 DIAGNOSIS — R7309 Other abnormal glucose: Secondary | ICD-10-CM

## 2020-06-20 NOTE — Progress Notes (Signed)
Order placed for 3 hr gtt to be done at 3/29 visit. 2nd attempt.

## 2020-06-21 ENCOUNTER — Other Ambulatory Visit: Payer: Self-pay | Admitting: Obstetrics and Gynecology

## 2020-06-21 LAB — GESTATIONAL GLUCOSE TOLERANCE

## 2020-06-21 MED ORDER — ACCU-CHEK SOFTCLIX LANCETS MISC: 1.0000 | Freq: Four times a day (QID) | 11 refills | Status: DC

## 2020-06-21 MED ORDER — GLUCOSE BLOOD VI STRP: ORAL_STRIP | 11 refills | Status: DC

## 2020-06-21 MED ORDER — ACCU-CHEK AVIVA PLUS W/DEVICE KIT: 1.0000 [IU] | PACK | 0 refills | Status: DC

## 2020-07-16 ENCOUNTER — Encounter: Payer: Medicaid Other | Admitting: Obstetrics and Gynecology

## 2020-07-16 ENCOUNTER — Other Ambulatory Visit: Payer: Medicaid Other

## 2020-07-16 ENCOUNTER — Ambulatory Visit: Payer: Medicaid Other

## 2020-07-16 DIAGNOSIS — Z369 Encounter for antenatal screening, unspecified: Secondary | ICD-10-CM

## 2020-07-16 DIAGNOSIS — O0992 Supervision of high risk pregnancy, unspecified, second trimester: Secondary | ICD-10-CM

## 2020-07-25 ENCOUNTER — Other Ambulatory Visit: Payer: Medicaid Other

## 2020-07-25 ENCOUNTER — Ambulatory Visit (INDEPENDENT_AMBULATORY_CARE_PROVIDER_SITE_OTHER): Payer: Medicaid Other

## 2020-07-25 ENCOUNTER — Ambulatory Visit (INDEPENDENT_AMBULATORY_CARE_PROVIDER_SITE_OTHER): Payer: Medicaid Other | Admitting: Obstetrics and Gynecology

## 2020-07-25 ENCOUNTER — Other Ambulatory Visit: Payer: Self-pay

## 2020-07-25 VITALS — BP 126/72 | Wt 287.0 lb

## 2020-07-25 DIAGNOSIS — Z3A26 26 weeks gestation of pregnancy: Secondary | ICD-10-CM

## 2020-07-25 DIAGNOSIS — O0992 Supervision of high risk pregnancy, unspecified, second trimester: Secondary | ICD-10-CM

## 2020-07-25 DIAGNOSIS — O99210 Obesity complicating pregnancy, unspecified trimester: Secondary | ICD-10-CM

## 2020-07-25 DIAGNOSIS — Z113 Encounter for screening for infections with a predominantly sexual mode of transmission: Secondary | ICD-10-CM

## 2020-07-25 DIAGNOSIS — Z369 Encounter for antenatal screening, unspecified: Secondary | ICD-10-CM | POA: Diagnosis not present

## 2020-07-25 DIAGNOSIS — R7309 Other abnormal glucose: Secondary | ICD-10-CM

## 2020-07-25 DIAGNOSIS — O0990 Supervision of high risk pregnancy, unspecified, unspecified trimester: Secondary | ICD-10-CM

## 2020-07-25 LAB — POCT URINALYSIS DIPSTICK OB
Glucose, UA: NEGATIVE
POC,PROTEIN,UA: NEGATIVE

## 2020-07-25 MED ORDER — ESOMEPRAZOLE MAGNESIUM 20 MG PO CPDR: 20.0000 mg | DELAYED_RELEASE_CAPSULE | Freq: Every day | ORAL | 11 refills | Status: DC

## 2020-07-25 NOTE — Progress Notes (Signed)
Routine Prenatal Care Visit  Subjective  Brittney Lee is a 35 y.o. G3P2002 at [redacted]w[redacted]d being seen today for ongoing prenatal care.  She is currently monitored for the following issues for this high-risk pregnancy and has Supervision of high risk pregnancy, antepartum and Maternal obesity, antepartum on their problem list.  ----------------------------------------------------------------------------------- Patient reports persistent issues with sinusinitis, which she states she is currently "managing" with. Additionally, she reports dealing with heartburn daily. .   Contractions: Not present. Vag. Bleeding: None.  Movement: Present. Denies leaking of fluid.  ----------------------------------------------------------------------------------- The following portions of the patient's history were reviewed and updated as appropriate: allergies, current medications, past family history, past medical history, past social history, past surgical history and problem list. Problem list updated.   Objective  Blood pressure 126/72, weight 287 lb (130.2 kg), last menstrual period 01/24/2020. Pregravid weight 273 lb (123.8 kg) Total Weight Gain 14 lb (6.35 kg) Urinalysis:      Fetal Status: Fetal Heart Rate (bpm): 145 Fundal Height: 27 cm Movement: Present     General:  Alert, oriented and cooperative. Patient is in no acute distress.  Skin: Skin is warm and dry. No rash noted.   Cardiovascular: Normal heart rate noted  Respiratory: Normal respiratory effort, no problems with respiration noted  Abdomen: Soft, gravid, appropriate for gestational age. Pain/Pressure: Absent     Pelvic:  Cervical exam deferred        Extremities: Normal range of motion.     ental Status: Normal mood and affect. Normal behavior. Normal judgment and thought content.     Assessment   35 y.o. [id number] at [redacted]w[redacted]d by  10/30/2020, by Last Menstrual Period presenting for routine prenatal visit  Plan   pregnancy 3 Problems  (from 04/04/20 to present)    Problem Noted Resolved   Supervision of high risk pregnancy, antepartum 04/22/2020 by Vena Austria, MD No   Overview Addendum 07/26/2020 10:34 AM by Zipporah Plants, CNM     Nursing Staff Provider  Office Location  [facility] Dating   LMP = 12 week [place]  Language  English Anatomy US   complete, normal  Flu Vaccine   declined Genetic Screen  NIPS:  Normal XY  TDaP vaccine    Hgb A1C or  GTT Early: 1h: 173 - repeat 1h: 155;  Third trimester: 3h at 26w1 - 74/139/138/71  Rhogam   n/a   LAB RESULTS   Feeding Plan  formula Blood Type AB/Positive/-- (12/16 1637)   Contraception  Antibody Negative (12/16 1637)  Circumcision  Rubella 14.50 (12/16 1637)  Pediatrician   RPR Non Reactive (04/07 1238)   Support Person  Greig Castilla HBsAg Negative (12/16 1637)   Prenatal Classes  HIV Non Reactive (04/07 1238)    Varicella  immune  BTL Consent  GBS  (For PCN allergy, check sensitivities)        VBAC Consent  n/a Pap  ASCUS HPV negative 04/04/2020    Hgb Electro      CF      SMA               Previous Version      -Patient is completing her 3h GTT today - she had previously been unable to finish her first 3h GTT. We reviewed the screening process and what to anticipate if multiple values resulted as elevated. All questions answered.  -Nexium for heartburn.  -Third trimester labs were collected with her 3h GTT (CBC/RPR/HIV)  Second trimester precautions including but not  limited to vaginal bleeding, contractions, leaking of fluid and fetal movement were reviewed in detail with the patient.    Return in about 2 weeks (around 08/08/2020) for ROB - ROB with growth [place] in 4 weeks.  Zipporah Plants, CNM, MSN [facility], [facility] 07/26/2020, 10:35 AM      -nexium -formula Greig Castilla Reviewed [place]  Third tri labs

## 2020-07-26 LAB — CBC WITH DIFFERENTIAL/PLATELET
Basophils Absolute: 0 10*3/uL (ref 0.0–0.2)
Basos: 0 %
EOS (ABSOLUTE): 0.1 10*3/uL (ref 0.0–0.4)
Eos: 2 %
Hematocrit: 37.1 % (ref 34.0–46.6)
Hemoglobin: 11.9 g/dL (ref 11.1–15.9)
Immature Grans (Abs): 0 10*3/uL (ref 0.0–0.1)
Immature Granulocytes: 1 %
Lymphocytes Absolute: 2 10*3/uL (ref 0.7–3.1)
Lymphs: 29 %
MCH: 27.4 pg (ref 26.6–33.0)
MCHC: 32.1 g/dL (ref 31.5–35.7)
MCV: 85 fL (ref 79–97)
Monocytes Absolute: 0.5 10*3/uL (ref 0.1–0.9)
Monocytes: 8 %
Neutrophils Absolute: 4.3 10*3/uL (ref 1.4–7.0)
Neutrophils: 60 %
Platelets: 282 10*3/uL (ref 150–450)
RBC: 4.35 x10E6/uL (ref 3.77–5.28)
RDW: 13.4 % (ref 11.7–15.4)
WBC: 7 10*3/uL (ref 3.4–10.8)

## 2020-07-26 LAB — HIV ANTIBODY (ROUTINE TESTING W REFLEX): HIV Screen 4th Generation wRfx: NONREACTIVE

## 2020-07-26 LAB — GESTATIONAL GLUCOSE TOLERANCE
Glucose, Fasting: 74 mg/dL (ref 65–94)
Glucose, GTT - 1 Hour: 139 mg/dL (ref 65–179)
Glucose, GTT - 2 Hour: 138 mg/dL (ref 65–154)
Glucose, GTT - 3 Hour: 71 mg/dL (ref 65–139)

## 2020-07-26 LAB — RPR QUALITATIVE: RPR Ser Ql: NONREACTIVE

## 2020-08-08 ENCOUNTER — Other Ambulatory Visit: Payer: Self-pay

## 2020-08-08 ENCOUNTER — Ambulatory Visit (INDEPENDENT_AMBULATORY_CARE_PROVIDER_SITE_OTHER): Payer: BC Managed Care – PPO

## 2020-08-08 ENCOUNTER — Ambulatory Visit (INDEPENDENT_AMBULATORY_CARE_PROVIDER_SITE_OTHER): Payer: BC Managed Care – PPO | Admitting: Obstetrics and Gynecology

## 2020-08-08 ENCOUNTER — Encounter: Payer: Medicaid Other | Admitting: Obstetrics and Gynecology

## 2020-08-08 VITALS — BP 120/72 | Wt 293.0 lb

## 2020-08-08 DIAGNOSIS — Z3A27 27 weeks gestation of pregnancy: Secondary | ICD-10-CM

## 2020-08-08 DIAGNOSIS — Z3A28 28 weeks gestation of pregnancy: Secondary | ICD-10-CM

## 2020-08-08 DIAGNOSIS — O0990 Supervision of high risk pregnancy, unspecified, unspecified trimester: Secondary | ICD-10-CM

## 2020-08-08 DIAGNOSIS — Z7185 Encounter for immunization safety counseling: Secondary | ICD-10-CM

## 2020-08-08 DIAGNOSIS — O99210 Obesity complicating pregnancy, unspecified trimester: Secondary | ICD-10-CM

## 2020-08-08 DIAGNOSIS — Z23 Encounter for immunization: Secondary | ICD-10-CM

## 2020-08-08 LAB — POCT URINALYSIS DIPSTICK OB
Glucose, UA: NEGATIVE
POC,PROTEIN,UA: NEGATIVE

## 2020-08-08 NOTE — Addendum Note (Signed)
Addended by: Fortunato Curling R on: 08/08/2020 12:02 PM   Modules accepted: Orders

## 2020-08-08 NOTE — Progress Notes (Signed)
Routine Prenatal Care Visit  Subjective  Brittney Lee is a 35 y.o. G3P2002 at [redacted]w[redacted]d being seen today for ongoing prenatal care.  She is currently monitored for the following issues for this high-risk pregnancy and has Supervision of high risk pregnancy, antepartum and Maternal obesity, antepartum on their problem list.  ----------------------------------------------------------------------------------- Patient reports no complaints.   Contractions: Not present. Vag. Bleeding: None.  Movement: Present. Denies leaking of fluid.  ----------------------------------------------------------------------------------- The following portions of the patient's history were reviewed and updated as appropriate: allergies, current medications, past family history, past medical history, past social history, past surgical history and problem list. Problem list updated.   Objective  Blood pressure 120/72, weight 293 lb (132.9 kg), last menstrual period 01/24/2020. Pregravid weight 273 lb (123.8 kg) Total Weight Gain 20 lb (9.072 kg) Urinalysis:      Fetal Status: Fetal Heart Rate (bpm): 145 Fundal Height: 29 cm Movement: Present     General:  Alert, oriented and cooperative. Patient is in no acute distress.  Skin: Skin is warm and dry. No rash noted.   Cardiovascular: Normal heart rate noted  Respiratory: Normal respiratory effort, no problems with respiration noted  Abdomen: Soft, gravid, appropriate for gestational age. Pain/Pressure: Absent     Pelvic:  Cervical exam deferred        Extremities: Normal range of motion.     ental Status: Normal mood and affect. Normal behavior. Normal judgment and thought content.     Assessment   35 y.o. [id number] at [redacted]w[redacted]d by  10/30/2020, by Last Menstrual Period presenting for routine prenatal visit  Plan   pregnancy 3 Problems (from 04/04/20 to present)    Problem Noted Resolved   Supervision of high risk pregnancy, antepartum 04/22/2020 by Vena Austria, MD No   Overview Addendum 08/08/2020 11:13 AM by Zipporah Plants, CNM     Nursing Staff Provider  Office Location  [facility] Dating   LMP = 12 week [place]  Language  English Anatomy US   complete, normal  Flu Vaccine   declined Genetic Screen  NIPS:  Normal XY  TDaP vaccine   08/08/20 Hgb A1C or  GTT Early: 1h: 173 - repeat 1h: 155;  Third trimester: 3h at 26w1 - 74/139/138/71  Rhogam   n/a   LAB RESULTS   Feeding Plan  formula Blood Type AB/Positive/-- (12/16 1637)   Contraception  BTL Antibody Negative (12/16 1637)  Circumcision  Rubella 14.50 (12/16 1637)  Pediatrician   RPR Non Reactive (04/07 1238)   Support Person  Greig Castilla HBsAg Negative (12/16 1637)   Prenatal Classes  HIV Non Reactive (04/07 1238)    Varicella  immune  BTL Consent  08/08/20 GBS  (For PCN allergy, check sensitivities)        VBAC Consent  n/a Pap  ASCUS HPV negative 04/04/2020    Hgb Electro      CF      SMA               Previous Version      -BTL consent signed today -Tdap given today -Reviewed results from 3h GTT -Reviewed growth [place] from today - order placed for growth in 4-6 weeks  Preterm labor precautions including but not limited to vaginal bleeding, contractions, leaking of fluid and fetal movement were reviewed in detail with the patient.    Return in about 2 weeks (around 08/22/2020) for ROB - growth [place] ordered for 4-6 weeks.  Zipporah Plants, CNM, MSN [facility], [facility] 08/08/2020, 11:16 AM

## 2020-08-09 ENCOUNTER — Other Ambulatory Visit: Payer: Self-pay | Admitting: Obstetrics and Gynecology

## 2020-08-09 DIAGNOSIS — O0990 Supervision of high risk pregnancy, unspecified, unspecified trimester: Secondary | ICD-10-CM

## 2020-08-22 ENCOUNTER — Other Ambulatory Visit: Payer: Self-pay

## 2020-08-22 ENCOUNTER — Ambulatory Visit (INDEPENDENT_AMBULATORY_CARE_PROVIDER_SITE_OTHER): Payer: Medicaid Other | Admitting: Obstetrics and Gynecology

## 2020-08-22 VITALS — BP 138/70 | Wt 297.0 lb

## 2020-08-22 DIAGNOSIS — O0993 Supervision of high risk pregnancy, unspecified, third trimester: Secondary | ICD-10-CM

## 2020-08-22 DIAGNOSIS — O0990 Supervision of high risk pregnancy, unspecified, unspecified trimester: Secondary | ICD-10-CM

## 2020-08-22 DIAGNOSIS — Z3A30 30 weeks gestation of pregnancy: Secondary | ICD-10-CM

## 2020-08-22 LAB — POCT URINALYSIS DIPSTICK OB
Glucose, UA: NEGATIVE
POC,PROTEIN,UA: NEGATIVE

## 2020-08-22 NOTE — Progress Notes (Signed)
    Routine Prenatal Care Visit  Subjective  Brittney Lee is a 35 y.o. G3P2002 at [redacted]w[redacted]d being seen today for ongoing prenatal care.  She is currently monitored for the following issues for this high-risk pregnancy and has Supervision of high risk pregnancy, antepartum and Maternal obesity, antepartum on their problem list.  ----------------------------------------------------------------------------------- Patient reports increased pelvic pressure associated with sitting at work..   Contractions: Not present. Vag. Bleeding: None.  Movement: Present. Denies leaking of fluid.  ----------------------------------------------------------------------------------- The following portions of the patient's history were reviewed and updated as appropriate: allergies, current medications, past family history, past medical history, past social history, past surgical history and problem list. Problem list updated.   Objective  Blood pressure 138/70, weight 297 lb (134.7 kg), last menstrual period 01/24/2020. Pregravid weight 273 lb (123.8 kg) Total Weight Gain 24 lb (10.9 kg) Urinalysis:      Fetal Status: Fetal Heart Rate (bpm): 148 Fundal Height: 32 cm Movement: Present     General:  Alert, oriented and cooperative. Patient is in no acute distress.  Skin: Skin is warm and dry. No rash noted.   Cardiovascular: Normal heart rate noted  Respiratory: Normal respiratory effort, no problems with respiration noted  Abdomen: Soft, gravid, appropriate for gestational age. Pain/Pressure: Present     Pelvic:  Cervical exam deferred        Extremities: Normal range of motion.     ental Status: Normal mood and affect. Normal behavior. Normal judgment and thought content.     Assessment   35 y.o. [id number] at [redacted]w[redacted]d by  10/30/2020, by Last Menstrual Period presenting for routine prenatal visit  Plan   pregnancy 3 Problems (from 04/04/20 to present)    Problem Noted Resolved   Supervision of high risk  pregnancy, antepartum 04/22/2020 by Vena Austria, MD No   Overview Addendum 08/08/2020 11:17 AM by Zipporah Plants, CNM     Nursing Staff Provider  Office Location  [facility] Dating   LMP = 12 week [place]  Language  English Anatomy US   complete, normal  Flu Vaccine   declined Genetic Screen  NIPS:  Normal XY  TDaP vaccine   08/08/20 Hgb A1C or  GTT Early: 1h: 173 - repeat 1h: 155;  Third trimester: 3h at 26w1 - 74/139/138/71  Rhogam   n/a   LAB RESULTS   Feeding Plan  formula Blood Type AB/Positive/-- (12/16 1637)   Contraception  BTL Antibody Negative (12/16 1637)  Circumcision  Rubella 14.50 (12/16 1637)  Pediatrician   RPR Non Reactive (04/07 1238)   Support Person  Greig Castilla HBsAg Negative (12/16 1637)   Prenatal Classes  HIV Non Reactive (04/07 1238)    Varicella  immune  BTL Consent  08/08/20 GBS  (For PCN allergy, check sensitivities)        VBAC Consent  n/a Pap  ASCUS HPV negative 04/04/2020    Hgb Electro      CF      SMA               Previous Version      -Growth scan scheduled  Preterm labor precautions including but not limited to vaginal bleeding, contractions, leaking of fluid and fetal movement were reviewed in detail with the patient.    Return in about 2 weeks (around 09/05/2020) for ROB.  Zipporah Plants, CNM, MSN [facility], [facility] 08/22/2020, 3:28 PM

## 2020-09-06 ENCOUNTER — Encounter: Payer: Self-pay | Admitting: Obstetrics and Gynecology

## 2020-09-06 ENCOUNTER — Encounter: Payer: Medicaid Other | Admitting: Obstetrics and Gynecology

## 2020-09-06 ENCOUNTER — Other Ambulatory Visit: Payer: Self-pay

## 2020-09-06 ENCOUNTER — Observation Stay
Admission: EM | Admit: 2020-09-06 | Discharge: 2020-09-06 | Disposition: A | Discharge status: Home / Self Care | Payer: BC Managed Care – PPO | Attending: Obstetrics and Gynecology | Admitting: Obstetrics and Gynecology

## 2020-09-06 ENCOUNTER — Ambulatory Visit (INDEPENDENT_AMBULATORY_CARE_PROVIDER_SITE_OTHER): Payer: Medicaid Other | Admitting: Obstetrics and Gynecology

## 2020-09-06 VITALS — BP 148/92 | Wt 307.0 lb

## 2020-09-06 DIAGNOSIS — Z3A32 32 weeks gestation of pregnancy: Secondary | ICD-10-CM | POA: Insufficient documentation

## 2020-09-06 DIAGNOSIS — O133 Gestational [pregnancy-induced] hypertension without significant proteinuria, third trimester: Principal | ICD-10-CM | POA: Insufficient documentation

## 2020-09-06 DIAGNOSIS — F17200 Nicotine dependence, unspecified, uncomplicated: Secondary | ICD-10-CM | POA: Insufficient documentation

## 2020-09-06 DIAGNOSIS — O163 Unspecified maternal hypertension, third trimester: Secondary | ICD-10-CM

## 2020-09-06 DIAGNOSIS — O0990 Supervision of high risk pregnancy, unspecified, unspecified trimester: Secondary | ICD-10-CM

## 2020-09-06 DIAGNOSIS — O99333 Smoking (tobacco) complicating pregnancy, third trimester: Secondary | ICD-10-CM | POA: Insufficient documentation

## 2020-09-06 LAB — CBC
HCT: 34.8 % — ABNORMAL LOW (ref 36.0–46.0)
Hemoglobin: 11.8 g/dL — ABNORMAL LOW (ref 12.0–15.0)
MCH: 28 pg (ref 26.0–34.0)
MCHC: 33.9 g/dL (ref 30.0–36.0)
MCV: 82.5 fL (ref 80.0–100.0)
Platelets: 253 10*3/uL (ref 150–400)
RBC: 4.22 MIL/uL (ref 3.87–5.11)
RDW: 14.1 % (ref 11.5–15.5)
WBC: 7.1 10*3/uL (ref 4.0–10.5)
nRBC: 0 % (ref 0.0–0.2)

## 2020-09-06 LAB — POCT URINALYSIS DIPSTICK OB: Glucose, UA: NEGATIVE

## 2020-09-06 LAB — COMPREHENSIVE METABOLIC PANEL
ALT: 16 U/L (ref 0–44)
AST: 20 U/L (ref 15–41)
Albumin: 2.8 g/dL — ABNORMAL LOW (ref 3.5–5.0)
Alkaline Phosphatase: 80 U/L (ref 38–126)
Anion gap: 7 (ref 5–15)
BUN: 6 mg/dL (ref 6–20)
CO2: 22 mmol/L (ref 22–32)
Calcium: 9 mg/dL (ref 8.9–10.3)
Chloride: 107 mmol/L (ref 98–111)
Creatinine, Ser: 0.6 mg/dL (ref 0.44–1.00)
GFR, Estimated: >60 mL/min (ref >60)
Glucose, Bld: 74 mg/dL (ref 70–99)
Potassium: 3.2 mmol/L — ABNORMAL LOW (ref 3.5–5.1)
Sodium: 136 mmol/L (ref 135–145)
Total Bilirubin: 0.6 mg/dL (ref 0.3–1.2)
Total Protein: 6.5 g/dL (ref 6.5–8.1)

## 2020-09-06 LAB — PROTEIN / CREATININE RATIO, URINE
Creatinine, Urine: 221 mg/dL
Protein Creatinine Ratio: 0.07 mg/mg{Cre} (ref 0.00–0.15)
Total Protein, Urine: 15 mg/dL

## 2020-09-06 NOTE — OB Triage Note (Signed)
Sent over from [facility] office for [facility] evaluation. Denies HA, visual disturbances, epigastric pain. Mild pitting edema noted to B lower extremities. Elaina Hoops

## 2020-09-06 NOTE — Discharge Summary (Signed)
Physician Final Progress Note  Patient ID: RYIAN LYNDE MRN: [id number] DOB/AGE: [birth year] 32 y.o.  Admit date: 09/06/2020 Admitting provider: Tresea Mall, CNM Discharge date: 09/06/2020   Admission Diagnoses: elevated blood pressure in third trimester  Discharge Diagnoses:  Active Problems:   Elevated blood pressure complicating pregnancy, antepartum, third trimester   Labor and delivery, indication for care   [redacted] weeks gestation of pregnancy   Gestational hypertension, third trimester   History of Present Illness: The patient is a 35 y.o. female G3P2002 at [redacted]w[redacted]d who presents from the office for [facility] evaluation due to elevated BP in clinic. She was admitted for observation, placed on monitors, labs collected. Her blood pressure remains in the mild range. She denies headache, visual changes or epigastric pain. She denies contractions, leakage of fluid or vaginal bleeding. She reports good fetal movement. Labs and monitoring are all reassuring. She is discharged to home with instructions and precautions. She will be seen in clinic on Monday next week for blood pressure check.  Past Medical History:  Diagnosis Date  . GERD (gastroesophageal reflux disease)   . Seasonal allergies     Past Surgical History:  Procedure Laterality Date  . NO PAST SURGERIES      No current facility-administered medications on file prior to encounter.   Current Outpatient Medications on File Prior to Encounter  Medication Sig Dispense Refill  . Cetirizine HCl 10 MG CAPS Take by mouth.    . esomeprazole (NEXIUM) 20 MG capsule Take 1 capsule (20 mg total) by mouth daily. 30 capsule 11  . fluticasone (FLONASE) 50 MCG/ACT nasal spray Place into the nose.    . Prenatal Vit-Fe Fumarate-FA (PREPLUS) 27-1 MG TABS       No Known Allergies  Social History   Socioeconomic History  . Marital status: Single    Spouse name: Not on file  . Number of children: Not on file  . Years of education: Not on  file  . Highest education level: Not on file  Occupational History  . Not on file  Tobacco Use  . Smoking status: Current Every Day Smoker  . Smokeless tobacco: Never Used  Substance and Sexual Activity  . Alcohol use: Not Currently    Comment: occas  . Drug use: Not Currently  . Sexual activity: Yes    Birth control/protection: Implant  Other Topics Concern  . Not on file  Social History Narrative  . Not on file   Social Determinants of Health   Financial Resource Strain: Not on file  Food Insecurity: Not on file  Transportation Needs: Not on file  Physical Activity: Not on file  Stress: Not on file  Social Connections: Not on file  Intimate Partner Violence: Not on file    Family History  Problem Relation Age of Onset  . Diabetes Mother      Review of Systems  Constitutional: Negative for chills and fever.  HENT: Negative for congestion, ear discharge, ear pain, hearing loss, sinus pain and sore throat.   Eyes: Negative for blurred vision and double vision.  Respiratory: Negative for cough, shortness of breath and wheezing.   Cardiovascular: Negative for chest pain, palpitations and leg swelling.  Gastrointestinal: Negative for abdominal pain, blood in stool, constipation, diarrhea, heartburn, melena, nausea and vomiting.  Genitourinary: Negative for dysuria, flank pain, frequency, hematuria and urgency.  Musculoskeletal: Negative for back pain, joint pain and myalgias.  Skin: Negative for itching and rash.  Neurological: Negative for dizziness, tingling, tremors, sensory  change, speech change, focal weakness, seizures, loss of consciousness, weakness and headaches.  Endo/Heme/Allergies: Negative for environmental allergies. Does not bruise/bleed easily.  Psychiatric/Behavioral: Negative for depression, hallucinations, memory loss, substance abuse and suicidal ideas. The patient is not nervous/anxious and does not have insomnia.      Physical Exam: BP 136/79    Pulse 89   Resp 18   Ht 5\' 8"  (1.727 m)   Wt (!) 139.3 kg   LMP 01/24/2020 (Exact Date)   BMI 46.68 kg/m   Constitutional: Well nourished, well developed female in no acute distress.  HEENT: normal Skin: Warm and dry.  Cardiovascular: Regular rate and rhythm.   Extremity: bilateral 1+ pitting edema lower extremities  Respiratory: Clear to auscultation bilateral. Normal respiratory effort Abdomen: FHT present Back: no CVAT Neuro: DTRs 2+, Cranial nerves grossly intact Psych: Alert and Oriented x3. No memory deficits. Normal mood and affect.   Patient Vitals for the past 24 hrs:  BP Pulse Resp Height Weight  09/06/20 1353 136/79 89 -- -- --  09/06/20 1338 (!) 146/81 79 -- -- --  09/06/20 1323 (!) 146/82 80 -- -- --  09/06/20 1310 (!) 141/80 76 -- -- --  09/06/20 1253 (!) 147/88 75 -- -- --  09/06/20 1238 140/80 79 -- -- --  09/06/20 1223 140/82 78 -- -- --  09/06/20 1215 -- -- -- 5\' 8"  (1.727 m) (!) 139.3 kg  09/06/20 1208 (!) 143/86 75 18 -- --    Toco: negative Fetal well being: 135 bpm, moderate variability, +accelerations, -decelerations   Consults: None  Significant Findings/ Diagnostic Studies: labs:  Results for JEWEL, MCAFEE (MRN [id number]) as of 09/06/2020 14:52  Ref. Range 09/06/2020 12:25 09/06/2020 12:33  COMPREHENSIVE METABOLIC PANEL Unknown  Rpt (A)  Sodium Latest Ref Range: 135 - 145 mmol/L  136  Potassium Latest Ref Range: 3.5 - 5.1 mmol/L  3.2 (L)  Chloride Latest Ref Range: 98 - 111 mmol/L  107  CO2 Latest Ref Range: 22 - 32 mmol/L  22  Glucose Latest Ref Range: 70 - 99 mg/dL  74  BUN Latest Ref Range: 6 - 20 mg/dL  6  Creatinine Latest Ref Range: 0.44 - 1.00 mg/dL  09/08/2020  Calcium Latest Ref Range: 8.9 - 10.3 mg/dL  9.0  Anion gap Latest Ref Range: 5 - 15   7  Alkaline Phosphatase Latest Ref Range: 38 - 126 U/L  80  Albumin Latest Ref Range: 3.5 - 5.0 g/dL  2.8 (L)  AST Latest Ref Range: 15 - 41 U/L  20  ALT Latest Ref Range: 0 - 44 U/L  16  Total  Protein Latest Ref Range: 6.5 - 8.1 g/dL  6.5  Total Bilirubin Latest Ref Range: 0.3 - 1.2 mg/dL  0.6  GFR, Estimated Latest Ref Range: >60 mL/min  >60  WBC Latest Ref Range: 4.0 - 10.5 K/uL  7.1  RBC Latest Ref Range: 3.87 - 5.11 MIL/uL  4.22  Hemoglobin Latest Ref Range: 12.0 - 15.0 g/dL  09/08/2020 (L)  HCT Latest Ref Range: 36.0 - 46.0 %  34.8 (L)  MCV Latest Ref Range: 80.0 - 100.0 fL  82.5  MCH Latest Ref Range: 26.0 - 34.0 pg  28.0  MCHC Latest Ref Range: 30.0 - 36.0 g/dL  6.07  RDW Latest Ref Range: 11.5 - 15.5 %  14.1  Platelets Latest Ref Range: 150 - 400 K/uL  253  nRBC Latest Ref Range: 0.0 - 0.2 %  0.0  Total Protein,  Urine Latest Units: mg/dL 15   Protein Creatinine Ratio Latest Ref Range: 0.00 - 0.15 mg/mgCre 0.07   Creatinine, Urine Latest Units: mg/dL 191     Procedures: NST  Hospital Course: The patient was admitted to Labor and Delivery Triage for observation.   Discharge Condition: good  Disposition: Discharge disposition: 01-Home or Self Care  Diet: Regular diet  Discharge Activity: Activity as tolerated  Discharge Instructions    Discharge activity:   Complete by: As directed    Check blood pressure as needed Notify physician for severe headache not relieved by tylenol, visual changes, epigastric pain, blood pressure greater than 140s/90s.  Return to clinic on Monday next week for blood pressure check   No sexual activity restrictions   Complete by: As directed    Notify physician for a general feeling that "something is not right"   Complete by: As directed    Notify physician for increase or change in vaginal discharge   Complete by: As directed    Notify physician for intestinal cramps, with or without diarrhea, sometimes described as "gas pain"   Complete by: As directed    Notify physician for leaking of fluid   Complete by: As directed    Notify physician for low, dull backache, unrelieved by heat or Tylenol   Complete by: As directed    Notify  physician for menstrual like cramps   Complete by: As directed    Notify physician for pelvic pressure   Complete by: As directed    Notify physician for uterine contractions.  These may be painless and feel like the uterus is tightening or the baby is  "balling up"   Complete by: As directed    Notify physician for vaginal bleeding   Complete by: As directed    PRETERM LABOR:  Includes any of the follwing symptoms that occur between 20 - [redacted] weeks gestation.  If these symptoms are not stopped, preterm labor can result in preterm delivery, placing your baby at risk   Complete by: As directed      Allergies as of 09/06/2020   No Known Allergies     Medication List    TAKE these medications   Cetirizine HCl 10 MG Caps Take by mouth.   esomeprazole 20 MG capsule Commonly known as: NexIUM Take 1 capsule (20 mg total) by mouth daily.   fluticasone 50 MCG/ACT nasal spray Commonly known as: FLONASE Place into the nose.   PrePLUS 27-1 MG Tabs     Low dose aspirin: 81 mg daily    Follow-up Information    [facility] Follow up.   Specialty: Obstetrics and Gynecology Why: Go to appointment on Monday May 23rd for a blood pressue check with MD Contact information: [address] [place] [address] [phone]              Total time spent taking care of this patient: 28 minutes  Signed: Tresea Mall, CNM  09/06/2020, 2:50 PM

## 2020-09-06 NOTE — Progress Notes (Signed)
Routine Prenatal Care Visit  Subjective  Brittney Lee is a 35 y.o. G3P2002 at [redacted]w[redacted]d being seen today for ongoing prenatal care.  She is currently monitored for the following issues for this high-risk pregnancy and has Supervision of high risk pregnancy, antepartum and Maternal obesity, antepartum on their problem list.  ----------------------------------------------------------------------------------- Patient reports persistent back and lower abdominal pain associated with sitting for long periods at work. She denies S&S of preE including HA, vision changes, RUQ pain..   Contractions: Not present. Vag. Bleeding: None.  Movement: Present. Denies leaking of fluid.  ----------------------------------------------------------------------------------- The following portions of the patient's history were reviewed and updated as appropriate: allergies, current medications, past family history, past medical history, past social history, past surgical history and problem list. Problem list updated.   Objective  Blood pressure (!) 146/90, 148/92, weight (!) 307 lb (139.3 kg), last menstrual period 01/24/2020. Pregravid weight 273 lb (123.8 kg) Total Weight Gain 34 lb (15.4 kg) Urinalysis:      Fetal Status: Fetal Heart Rate (bpm): 140 Fundal Height: 31 cm Movement: Present     General:  Alert, oriented and cooperative. Patient is in no acute distress.  Skin: Skin is warm and dry. No rash noted.   Cardiovascular: Normal heart rate noted  Respiratory: Normal respiratory effort, no problems with respiration noted  Abdomen: Soft, gravid, appropriate for gestational age. Pain/Pressure: Present     Pelvic:  Cervical exam deferred        Extremities: Normal range of motion.     ental Status: Normal mood and affect. Normal behavior. Normal judgment and thought content.     Assessment   35 y.o. [id number] at [redacted]w[redacted]d by  10/30/2020, by Last Menstrual Period presenting for routine prenatal  visit  Plan   pregnancy 3 Problems (from 04/04/20 to present)    Problem Noted Resolved   Supervision of high risk pregnancy, antepartum 04/22/2020 by Vena Austria, MD No   Overview Addendum 08/08/2020 11:17 AM by Zipporah Plants, CNM     Nursing Staff Provider  Office Location  [facility] Dating   LMP = 12 week [place]  Language  English Anatomy US   complete, normal  Flu Vaccine   declined Genetic Screen  NIPS:  Normal XY  TDaP vaccine   08/08/20 Hgb A1C or  GTT Early: 1h: 173 - repeat 1h: 155;  Third trimester: 3h at 26w1 - 74/139/138/71  Rhogam   n/a   LAB RESULTS   Feeding Plan  formula Blood Type AB/Positive/-- (12/16 1637)   Contraception  BTL Antibody Negative (12/16 1637)  Circumcision  Rubella 14.50 (12/16 1637)  Pediatrician   RPR Non Reactive (04/07 1238)   Support Person  Greig Castilla HBsAg Negative (12/16 1637)   Prenatal Classes  HIV Non Reactive (04/07 1238)    Varicella  immune  BTL Consent  08/08/20 GBS  (For PCN allergy, check sensitivities)        VBAC Consent  n/a Pap  ASCUS HPV negative 04/04/2020    Hgb Electro      CF      SMA               Previous Version      -Elevated BP x2 during visit today - discussed findings with patient and need for further evaluation - patient to present to [facility] for PIH work-up. On-call provider notified via phone. -Reviewed chart - patient without documented hx of cHTN, did have elevated BP at NOB visit  Preterm precautions  including but not limited to vaginal bleeding, contractions, leaking of fluid and fetal movement were reviewed in detail with the patient.    Return in about 1 week (around 09/13/2020).  Zipporah Plants, CNM, MSN [facility], [facility] 09/06/2020, 11:35 AM

## 2020-09-06 NOTE — Progress Notes (Signed)
Pt discharge home. Follow-up appointment already made for a BP check for Tuesday. Discharge instructions given and discussed PIH symptoms and reasons to call provider. Left floor by herself, ambulatory. Elaina Hoops

## 2020-09-09 ENCOUNTER — Encounter: Payer: Self-pay | Admitting: Obstetrics & Gynecology

## 2020-09-09 ENCOUNTER — Encounter: Payer: Self-pay | Admitting: Obstetrics and Gynecology

## 2020-09-09 ENCOUNTER — Ambulatory Visit (INDEPENDENT_AMBULATORY_CARE_PROVIDER_SITE_OTHER): Payer: Medicaid Other | Admitting: Obstetrics and Gynecology

## 2020-09-09 ENCOUNTER — Observation Stay
Admission: EM | Admit: 2020-09-09 | Discharge: 2020-09-09 | Disposition: A | Discharge status: Home / Self Care | Payer: BC Managed Care – PPO | Attending: Obstetrics & Gynecology | Admitting: Obstetrics & Gynecology

## 2020-09-09 ENCOUNTER — Other Ambulatory Visit: Payer: Self-pay

## 2020-09-09 VITALS — BP 150/90 | Wt 311.4 lb

## 2020-09-09 DIAGNOSIS — Z3A32 32 weeks gestation of pregnancy: Secondary | ICD-10-CM | POA: Insufficient documentation

## 2020-09-09 DIAGNOSIS — F17200 Nicotine dependence, unspecified, uncomplicated: Secondary | ICD-10-CM | POA: Diagnosis not present

## 2020-09-09 DIAGNOSIS — O133 Gestational [pregnancy-induced] hypertension without significant proteinuria, third trimester: Secondary | ICD-10-CM | POA: Diagnosis present

## 2020-09-09 DIAGNOSIS — O163 Unspecified maternal hypertension, third trimester: Secondary | ICD-10-CM

## 2020-09-09 DIAGNOSIS — O99333 Smoking (tobacco) complicating pregnancy, third trimester: Secondary | ICD-10-CM | POA: Insufficient documentation

## 2020-09-09 DIAGNOSIS — O0990 Supervision of high risk pregnancy, unspecified, unspecified trimester: Secondary | ICD-10-CM

## 2020-09-09 LAB — POCT URINALYSIS DIPSTICK OB: Glucose, UA: NEGATIVE

## 2020-09-09 LAB — URINALYSIS, ROUTINE W REFLEX MICROSCOPIC
Bacteria, UA: NONE SEEN
Bilirubin Urine: NEGATIVE
Glucose, UA: NEGATIVE mg/dL
Hgb urine dipstick: NEGATIVE
Ketones, ur: NEGATIVE mg/dL
Nitrite: NEGATIVE
Protein, ur: 30 mg/dL — AB
Specific Gravity, Urine: 1.023 (ref 1.005–1.030)
pH: 6 (ref 5.0–8.0)

## 2020-09-09 LAB — PROTEIN / CREATININE RATIO, URINE
Creatinine, Urine: 231 mg/dL
Protein Creatinine Ratio: 0.16 mg/mg{Cre} — ABNORMAL HIGH (ref 0.00–0.15)
Total Protein, Urine: 37 mg/dL

## 2020-09-09 LAB — CBC
HCT: 36.1 % (ref 36.0–46.0)
Hemoglobin: 12.3 g/dL (ref 12.0–15.0)
MCH: 28.2 pg (ref 26.0–34.0)
MCHC: 34.1 g/dL (ref 30.0–36.0)
MCV: 82.8 fL (ref 80.0–100.0)
Platelets: 247 10*3/uL (ref 150–400)
RBC: 4.36 MIL/uL (ref 3.87–5.11)
RDW: 14.1 % (ref 11.5–15.5)
WBC: 7 10*3/uL (ref 4.0–10.5)
nRBC: 0 % (ref 0.0–0.2)

## 2020-09-09 LAB — COMPREHENSIVE METABOLIC PANEL
ALT: 16 U/L (ref 0–44)
AST: 19 U/L (ref 15–41)
Albumin: 2.8 g/dL — ABNORMAL LOW (ref 3.5–5.0)
Alkaline Phosphatase: 75 U/L (ref 38–126)
Anion gap: 6 (ref 5–15)
BUN: 7 mg/dL (ref 6–20)
CO2: 24 mmol/L (ref 22–32)
Calcium: 8.9 mg/dL (ref 8.9–10.3)
Chloride: 106 mmol/L (ref 98–111)
Creatinine, Ser: 0.63 mg/dL (ref 0.44–1.00)
GFR, Estimated: >60 mL/min (ref >60)
Glucose, Bld: 78 mg/dL (ref 70–99)
Potassium: 3.5 mmol/L (ref 3.5–5.1)
Sodium: 136 mmol/L (ref 135–145)
Total Bilirubin: 0.6 mg/dL (ref 0.3–1.2)
Total Protein: 6.2 g/dL — ABNORMAL LOW (ref 6.5–8.1)

## 2020-09-09 MED ORDER — NIFEDIPINE ER OSMOTIC RELEASE 30 MG PO TB24
30.0000 mg | ORAL_TABLET | Freq: Every day | ORAL | Status: DC
  Administered 2020-09-09: 30 mg via ORAL

## 2020-09-09 MED ORDER — NIFEDIPINE ER 30 MG PO TB24: 30.0000 mg | ORAL_TABLET | Freq: Every day | ORAL | 1 refills | Status: DC

## 2020-09-09 MED ORDER — NIFEDIPINE ER OSMOTIC RELEASE 30 MG PO TB24
ORAL_TABLET | ORAL | Status: AC
  Filled 2020-09-09: qty 1

## 2020-09-09 NOTE — Discharge Summary (Signed)
Please see Final Progress Note  [name], CNM  09/09/2020 7:50 PM

## 2020-09-09 NOTE — Progress Notes (Signed)
Routine Prenatal Care Visit  Subjective  Brittney Lee is a 35 y.o. G3P2002 at [redacted]w[redacted]d being seen today for ongoing prenatal care.  She is currently monitored for the following issues for this high-risk pregnancy and has Supervision of high risk pregnancy, antepartum; Maternal obesity, antepartum; Elevated blood pressure complicating pregnancy, antepartum, third trimester; Labor and delivery, indication for care; [redacted] weeks gestation of pregnancy; and Gestational hypertension, third trimester on their problem list.  ----------------------------------------------------------------------------------- Patient reports no complaints.   Contractions: Not present. Vag. Bleeding: None.  Movement: Present. Denies leaking of fluid.  ----------------------------------------------------------------------------------- The following portions of the patient's history were reviewed and updated as appropriate: allergies, current medications, past family history, past medical history, past social history, past surgical history and problem list. Problem list updated.   Objective  Blood pressure (!) 150/90, weight (!) 311 lb 6.4 oz (141.3 kg), last menstrual period 01/24/2020. Pregravid weight 273 lb (123.8 kg) Total Weight Gain 38 lb 6.4 oz (17.4 kg) Urinalysis:      Fetal Status: Fetal Heart Rate (bpm): 140   Movement: Present     General:  Alert, oriented and cooperative. Patient is in no acute distress.  Skin: Skin is warm and dry. No rash noted.   Cardiovascular: Normal heart rate noted  Respiratory: Normal respiratory effort, no problems with respiration noted  Abdomen: Soft, gravid, appropriate for gestational age. Pain/Pressure: Absent     Pelvic:  Cervical exam deferred        Extremities: Normal range of motion.  Edema: Mild pitting, slight indentation  Mental Status: Normal mood and affect. Normal behavior. Normal judgment and thought content.     Assessment   35 y.o. P7T0626 at [redacted]w[redacted]d by   10/30/2020, by Last Menstrual Period presenting for routine prenatal visit  Plan   pregnancy 3 Problems (from 04/04/20 to present)    Problem Noted Resolved   Gestational hypertension, third trimester 09/06/2020 by Tresea Mall, CNM No   Supervision of high risk pregnancy, antepartum 04/22/2020 by Vena Austria, MD No   Overview Addendum 08/08/2020 11:17 AM by Zipporah Plants, CNM     Nursing Staff Provider  Office Location  [facility] Dating   LMP = 12 week [place]  Language  English Anatomy US   complete, normal  Flu Vaccine   declined Genetic Screen  NIPS:  Normal XY  TDaP vaccine   08/08/20 Hgb A1C or  GTT Early: 1h: 173 - repeat 1h: 155;  Third trimester: 3h at 26w1 - 74/139/138/71  Rhogam   n/a   LAB RESULTS   Feeding Plan  formula Blood Type AB/Positive/-- (12/16 1637)   Contraception  BTL Antibody Negative (12/16 1637)  Circumcision  Rubella 14.50 (12/16 1637)  Pediatrician   RPR Non Reactive (04/07 1238)   Support Person  Greig Castilla HBsAg Negative (12/16 1637)   Prenatal Classes  HIV Non Reactive (04/07 1238)    Varicella  immune  BTL Consent  08/08/20 GBS  (For PCN allergy, check sensitivities)        VBAC Consent  n/a Pap  ASCUS HPV negative 04/04/2020    Hgb Electro      CF      SMA               Previous Version       Advised to be seen on L&D for BP monitoring and repeat lab evaluation for preeclampsia. Discussed warning signs of preeclampsia including headache, vision changes, RUQ pain and nausea/vomiting. Reviewed moderate and severe  range blood pressure values in pregnancy.  Patient said she would need to return home to take care of her kids and then come to the hospital this evening.   If BP and labs are not elevated in the severe range with extended monitoring at the hospital I would recommend a 24 hour urine collection and initiation of an antihypertensive medication such as procardia. Close follow up planned for the office on 5/26 with MD. Possible MFM consultation.    Gestational age appropriate obstetric precautions including but not limited to vaginal bleeding, contractions, leaking of fluid and fetal movement were reviewed in detail with the patient.    Return in about 3 days (around 09/12/2020) for ROB with MD.  Natale Milch MD [facility], [facility] 09/09/2020, 5:15 PM

## 2020-09-09 NOTE — Final Progress Note (Signed)
Final Progress Note  Patient ID: Brittney Lee MRN: [id number] DOB/AGE: Jan 23, 1986 35 y.o.  Admit date: 09/09/2020 Admitting provider: Nadara Mustard, MD Discharge date: 09/09/2020   Admission Diagnoses: gestational hypertension  Third trimester of pregnancy [redacted] weeks gestation of pregnancy    Discharge Diagnoses:  Active Problems:   Elevated blood pressure affecting pregnancy in third trimester, antepartum  Reactive fetal heart tones  History of Present Illness: The patient is a 35 y.o. female G3P2002 at [redacted]w[redacted]d who presents for evaluation of high blood pressure.. she was seen in the office at [facility] today, and her blood pressures were elevated. She was sent to Labor and Delivery for labwork, and NST and consideration.her blood pressures at the office today was:150/90 .  Past Medical History:  Diagnosis Date  . GERD (gastroesophageal reflux disease)   . Seasonal allergies     Past Surgical History:  Procedure Laterality Date  . NO PAST SURGERIES      No current facility-administered medications on file prior to encounter.   Current Outpatient Medications on File Prior to Encounter  Medication Sig Dispense Refill  . Cetirizine HCl 10 MG CAPS Take by mouth.    . esomeprazole (NEXIUM) 20 MG capsule Take 1 capsule (20 mg total) by mouth daily. 30 capsule 11  . Prenatal Vit-Fe Fumarate-FA (PREPLUS) 27-1 MG TABS     . fluticasone (FLONASE) 50 MCG/ACT nasal spray Place into the nose. (Patient not taking: Reported on 09/09/2020)      No Known Allergies  Social History   Socioeconomic History  . Marital status: Single    Spouse name: Not on file  . Number of children: Not on file  . Years of education: Not on file  . Highest education level: Not on file  Occupational History  . Not on file  Tobacco Use  . Smoking status: Current Every Day Smoker  . Smokeless tobacco: Never Used  Substance and Sexual Activity  . Alcohol use: Not Currently    Comment: occas   . Drug use: Not Currently  . Sexual activity: Yes    Birth control/protection: Implant  Other Topics Concern  . Not on file  Social History Narrative  . Not on file   Social Determinants of Health   Financial Resource Strain: Not on file  Food Insecurity: Not on file  Transportation Needs: Not on file  Physical Activity: Not on file  Stress: Not on file  Social Connections: Not on file  Intimate Partner Violence: Not on file    Family History  Problem Relation Age of Onset  . Diabetes Mother      ROS   Physical Exam: BP (!) 121/104 (BP Location: Left Arm)   Pulse 82   Temp 98.3 F (36.8 C) (Oral)   Resp 18   LMP 01/24/2020 (Exact Date)   Physical Exam Constitutional:      Appearance: She is obese.  Genitourinary:     Genitourinary Comments: pelvic exam deferred  HENT:     Head: Normocephalic and atraumatic.     Comments: Denies any head aches or visual disturbance    Nose: Nose normal.  Cardiovascular:     Rate and Rhythm: Normal rate and regular rhythm.     Pulses: Normal pulses.     Heart sounds: Normal heart sounds.  Pulmonary:     Effort: Pulmonary effort is normal.     Breath sounds: Normal breath sounds.  Abdominal:     General: Bowel sounds are normal.  Palpations: Abdomen is soft.     Comments: Gravid abdomen. Adipose  Musculoskeletal:        General: Swelling present. Normal range of motion.     Cervical back: Normal range of motion and neck supple.     Right lower leg: Edema present.     Left lower leg: Edema present.     Comments: 1+ pitting edema. No clonus ; reflexes are 1+  Neurological:     General: No focal deficit present.     Mental Status: She is alert and oriented to person, place, and time.  Skin:    General: Skin is warm and dry.  Psychiatric:        Mood and Affect: Mood normal.        Behavior: Behavior normal.  Vitals reviewed.     Consults: None  Labs: CMP: AST is 19; ALT 16 H  and H: 12.3 and 36.1 UPC-  0.16  Significant Findings/ Diagnostic Studies: labs:  Procedures: NST NST Baseline FHR: 130 beats/min Variability: moderate Accelerations: present Decelerations: absent Tocometry: no contractions noted or palpated.  Interpretation:  INDICATIONS: pregnancy-induced hypertension RESULTS:  A NST procedure was performed with FHR monitoring and a normal baseline established, appropriate time of 20-40 minutes of evaluation, and accels >2 seen w 15x15 characteristics.  Results show a REACTIVE NST.    Hospital Course: The patient was admitted to Labor and Delivery Triage for observation. She was placed on the fetal monitor for an NST. PIH labs were ordered and drawn, and her vital were monitored over several hours. With a reactive strip and essentially normal labs, she is discharged home, and , due to elevated blood pressures, is started on Procardia 30 mg xl daily . She plans to follow up in two days with Dr. Tiburcio Pea at [facility]. She is also instructed to start gathering a 24 hour urine collection which she will deliver to the Outpatient Lab.  Discharge Condition: good  Disposition:  Discharge disposition: 01-Home or Self Care       Diet: Regular diet  Discharge Activity: Activity as tolerated  Discharge Instructions    Discharge activity:  No Restrictions   Complete by: As directed    Discharge diet:  No restrictions   Complete by: As directed    No sexual activity restrictions   Complete by: As directed    Notify physician for a general feeling that "something is not right"   Complete by: As directed    Notify physician for increase or change in vaginal discharge   Complete by: As directed    Notify physician for intestinal cramps, with or without diarrhea, sometimes described as "gas pain"   Complete by: As directed    Notify physician for leaking of fluid   Complete by: As directed    Notify physician for low, dull backache, unrelieved by heat or Tylenol   Complete by:  As directed    Notify physician for menstrual like cramps   Complete by: As directed    Notify physician for pelvic pressure   Complete by: As directed    Notify physician for uterine contractions.  These may be painless and feel like the uterus is tightening or the baby is  "balling up"   Complete by: As directed    Notify physician for vaginal bleeding   Complete by: As directed    PRETERM LABOR:  Includes any of the follwing symptoms that occur between 20 - [redacted] weeks gestation.  If these symptoms  are not stopped, preterm labor can result in preterm delivery, placing your baby at risk   Complete by: As directed      Allergies as of 09/09/2020   No Known Allergies     Medication List    TAKE these medications   Cetirizine HCl 10 MG Caps Take by mouth.   esomeprazole 20 MG capsule Commonly known as: NexIUM Take 1 capsule (20 mg total) by mouth daily.   fluticasone 50 MCG/ACT nasal spray Commonly known as: FLONASE Place into the nose.   NIFEdipine 30 MG 24 hr tablet Commonly known as: ADALAT CC Take 1 tablet (30 mg total) by mouth daily. Start taking on: Sep 10, 2020   PrePLUS 27-1 MG Tabs        Total time spent taking care of this patient: 45 minutes  Signed: [name], CNM  09/09/2020, 8:31 PM

## 2020-09-09 NOTE — Progress Notes (Signed)
156/100 left arm

## 2020-09-09 NOTE — Patient Instructions (Signed)
Preeclampsia and Eclampsia Preeclampsia is a serious condition that may develop during pregnancy. This condition involves high blood pressure during pregnancy and causes symptoms such as headaches, vision changes, and increased swelling in the legs, hands, and face. Preeclampsia occurs after 20 weeks of pregnancy. Eclampsia is a seizure that happens from worsening preeclampsia. Diagnosing and managing preeclampsia early is important. If not treated early, it can cause serious problems for mother and baby. There is no cure for this condition. However, during pregnancy, delivering the baby may be the best treatment for preeclampsia or eclampsia. For most women, symptoms of preeclampsia and eclampsia go away after giving birth. In rare cases, a woman may develop preeclampsia or eclampsia after giving birth. This usually occurs within 48 hours after childbirth but may occur up to 6 weeks after giving birth. What are the causes? The cause of this condition is not known. What increases the risk? The following factors make you more likely to develop preeclampsia:  Being pregnant for the first time or being pregnant with multiples.  Having had preeclampsia or a condition called hemolysis, elevated liver enzymes, and low platelet count (HELLP)syndrome during a past pregnancy.  Having a family history of preeclampsia.  Being older than age 35.  Being obese.  Becoming pregnant through fertility treatments. Conditions that reduce blood flow or oxygen to your placenta and baby may also increase your risk. These include:  High blood pressure before, during, or immediately following pregnancy.  Kidney disease.  Diabetes.  Blood clotting disorders.  Autoimmune diseases, such as lupus.  Sleep apnea. What are the signs or symptoms? Common symptoms of this condition include:  A severe, throbbing headache that does not go away.  Vision problems, such as blurred or double vision and light  sensitivity.  Pain in the stomach, especially the right upper region.  Pain in the shoulder. Other symptoms that may develop as the condition gets worse include:  Sudden weight gain because of fluid buildup in the body. This causes swelling of the face, hands, legs, and feet.  Severe nausea and vomiting.  Urinating less than usual.  Shortness of breath.  Seizures. How is this diagnosed? Your health care provider will ask you about symptoms and check for signs of preeclampsia during your prenatal visits. You will also have routine tests, including:  Checking your blood pressure.  Urine tests to check for protein.  Blood tests to assess your organ function.  Monitoring your baby's heart rate.  Ultrasounds to check fetal growth.   How is this treated? You and your health care provider will determine the treatment that is best for you. Treatment may include:  Frequent prenatal visits to check for preeclampsia.  Medicine to lower your blood pressure.  Medicine to prevent seizures.  Low-dose aspirin during your pregnancy.  Staying in the hospital, in severe cases. You will be given medicines to control your blood pressure and the amount of fluids in your body.  Delivering your baby. Work with your health care provider to manage any chronic health conditions, such as diabetes or kidney problems. Also, work with your health care provider to manage weight gain during pregnancy. Follow these instructions at home: Eating and drinking  Drink enough fluid to keep your urine pale yellow.  Avoid caffeine. Caffeine may increase blood pressure and heart rate and lead to dehydration.  Reduce the amount of salt that you eat. Lifestyle  Do not use any products that contain nicotine or tobacco. These products include cigarettes, chewing tobacco, and   vaping devices, such as e-cigarettes. If you need help quitting, ask your health care provider.  Do not use alcohol or drugs.  Avoid  stress as much as possible.  Rest and get plenty of sleep. General instructions  Take over-the-counter and prescription medicines only as told by your health care provider.  When lying down, lie on your left side. This keeps pressure off your major blood vessels.  When sitting or lying down, raise (elevate) your feet. Try putting pillows underneath your lower legs.  Exercise regularly. Ask your health care provider what kinds of exercise are best for you.  Check your blood pressure as often as recommended by your health care provider.  Keep all prenatal and follow-up visits. This is important.   Contact a health care provider if:  You have symptoms that may need treatment or closer monitoring. These include: ? Headaches. ? Stomach pain or nausea and vomiting. ? Shoulder pain. ? Vision problems, such as spots in front of your eyes or blurry vision. ? Sudden weight gain or increased swelling in your face, hands, legs, and feet. ? Increased anxiety or feeling of impending doom. ? Signs or symptoms of labor. Get help right away if:  You have any of the following symptoms: ? A seizure. ? Shortness of breath or trouble breathing. ? Trouble speaking or slurred speech. ? Fainting. ? Chest pain. These symptoms may represent a serious problem that is an emergency. Do not wait to see if the symptoms will go away. Get medical help right away. Call your local emergency services (911 in the [place] Do not drive yourself to the hospital. Summary  Preeclampsia is a serious condition that may develop during pregnancy.  Diagnosing and treating preeclampsia early is very important.  Keep all prenatal and follow-up visits. This is important.  Get help right away if you have a seizure, shortness of breath or trouble breathing, trouble speaking or slurred speech, chest pain, or fainting. This information is not intended to replace advice given to you by your health care provider. Make sure you  discuss any questions you have with your health care provider. Document Revised: 12/28/2019 Document Reviewed: 12/28/2019 Elsevier Patient Education  2021 Elsevier Inc.  

## 2020-09-10 ENCOUNTER — Encounter: Payer: Medicaid Other | Admitting: Advanced Practice Midwife

## 2020-09-11 ENCOUNTER — Other Ambulatory Visit: Payer: Self-pay | Admitting: Advanced Practice Midwife

## 2020-09-11 ENCOUNTER — Other Ambulatory Visit
Admission: RE | Admit: 2020-09-11 | Discharge: 2020-09-11 | Disposition: A | Discharge status: Home / Self Care | Payer: BC Managed Care – PPO | Source: Ambulatory Visit | Attending: Advanced Practice Midwife | Admitting: Advanced Practice Midwife

## 2020-09-11 DIAGNOSIS — Z3A00 Weeks of gestation of pregnancy not specified: Secondary | ICD-10-CM | POA: Diagnosis not present

## 2020-09-11 DIAGNOSIS — O163 Unspecified maternal hypertension, third trimester: Secondary | ICD-10-CM | POA: Insufficient documentation

## 2020-09-11 LAB — PROTEIN, URINE, 24 HOUR
Collection Interval-UPROT: 24 hours
Protein, 24H Urine: 243 mg/d — ABNORMAL HIGH (ref 50–100)
Protein, Urine: 9 mg/dL
Urine Total Volume-UPROT: 2700 mL

## 2020-09-11 NOTE — Progress Notes (Signed)
24 hour urine protein order placed for result at [facility]

## 2020-09-12 ENCOUNTER — Encounter: Payer: Self-pay | Admitting: Obstetrics & Gynecology

## 2020-09-12 ENCOUNTER — Other Ambulatory Visit: Payer: Self-pay

## 2020-09-12 ENCOUNTER — Ambulatory Visit (INDEPENDENT_AMBULATORY_CARE_PROVIDER_SITE_OTHER): Payer: Medicaid Other | Admitting: Obstetrics & Gynecology

## 2020-09-12 VITALS — BP 162/80 | Wt 311.0 lb

## 2020-09-12 DIAGNOSIS — O0993 Supervision of high risk pregnancy, unspecified, third trimester: Secondary | ICD-10-CM

## 2020-09-12 DIAGNOSIS — O133 Gestational [pregnancy-induced] hypertension without significant proteinuria, third trimester: Secondary | ICD-10-CM

## 2020-09-12 DIAGNOSIS — Z3A33 33 weeks gestation of pregnancy: Secondary | ICD-10-CM

## 2020-09-12 LAB — POCT URINALYSIS DIPSTICK OB
Glucose, UA: NEGATIVE
POC,PROTEIN,UA: NEGATIVE

## 2020-09-12 MED ORDER — LABETALOL HCL 200 MG PO TABS: 200.0000 mg | ORAL_TABLET | Freq: Two times a day (BID) | ORAL | 3 refills | Status: DC

## 2020-09-12 NOTE — Progress Notes (Signed)
  Subjective  Fetal Movement? yes Contractions? no Leaking Fluid? no Vaginal Bleeding? no Denies headache, blurry vision, CP, SOB, epigastric pain Edema present Works- office type work Recent labs- 24 hour urine 243   Blood work neg for signs of preeclampsia Recently started Procardia 30 mg daily  Objective  BP (!) 162/80   Wt (!) 311 lb (141.1 kg)   LMP 01/24/2020 (Exact Date)   BMI 47.29 kg/m  Rpeat BP 144/t82 General: NAD Pumonary: no increased work of breathing Abdomen: gravid, non-tender Extremities: no edema Psychiatric: mood appropriate, affect full  Assessment  35 y.o. [id number] at [redacted]w[redacted]d by  10/30/2020, by Last Menstrual Period presenting for routine prenatal visit  Plan   Problem List Items Addressed This Visit      Cardiovascular and Mediastinum   Gestational hypertension, third trimester   Relevant Medications   labetalol (NORMODYNE) 200 MG tablet     Other   Supervision of high risk pregnancy, antepartum - Primary    Other Visit Diagnoses    [redacted] weeks gestation of pregnancy          pregnancy 3 Problems (from 04/04/20 to present)    Problem Noted Resolved   Gestational hypertension, third trimester 09/06/2020 by Tresea Mall, CNM No   Supervision of high risk pregnancy, antepartum 04/22/2020 by Vena Austria, MD No   Overview Addendum 08/08/2020 11:17 AM by Zipporah Plants, CNM     Nursing Staff Provider  Office Location  [facility] Dating   LMP = 12 week [place]  Language  English Anatomy US   complete, normal  Flu Vaccine   declined Genetic Screen  NIPS:  Normal XY  TDaP vaccine   08/08/20 Hgb A1C or  GTT Early: 1h: 173 - repeat 1h: 155;  Third trimester: 3h at 26w1 - 74/139/138/71  Rhogam   n/a   LAB RESULTS   Feeding Plan  formula Blood Type AB/Positive/-- (12/16 1637)   Contraception  BTL Antibody Negative (12/16 1637)  Circumcision  Rubella 14.50 (12/16 1637)  Pediatrician   RPR Non Reactive (04/07 1238)   Support Person  Greig Castilla HBsAg Negative  (12/16 1637)   Prenatal Classes  HIV Non Reactive (04/07 1238)    Varicella  immune  BTL Consent  08/08/20 GBS  (For PCN allergy, check sensitivities)        VBAC Consent  n/a Pap  ASCUS HPV negative 04/04/2020    Hgb Electro      CF      SMA             [place] next week (MFM)  PNV, FMC  PTL precautions  Add Labetalol for HTN control  APT - NST weekly starting next week     AFI w MFM [place] next week  Risks of Gest HTN d/w pt   Annamarie Major, MD, [facility], [facility] 09/12/2020  4:26 PM

## 2020-09-12 NOTE — Addendum Note (Signed)
Addended by: Donnetta Hail on: 09/12/2020 04:30 PM   Modules accepted: Orders

## 2020-09-19 ENCOUNTER — Other Ambulatory Visit: Payer: Self-pay | Admitting: Obstetrics and Gynecology

## 2020-09-19 ENCOUNTER — Ambulatory Visit: Payer: BC Managed Care – PPO | Attending: Obstetrics and Gynecology

## 2020-09-19 ENCOUNTER — Other Ambulatory Visit: Payer: Self-pay

## 2020-09-19 ENCOUNTER — Ambulatory Visit: Payer: BC Managed Care – PPO | Admitting: *Deleted

## 2020-09-19 ENCOUNTER — Encounter: Payer: Self-pay | Admitting: *Deleted

## 2020-09-19 ENCOUNTER — Ambulatory Visit (HOSPITAL_BASED_OUTPATIENT_CLINIC_OR_DEPARTMENT_OTHER): Payer: BC Managed Care – PPO | Admitting: Obstetrics

## 2020-09-19 DIAGNOSIS — O163 Unspecified maternal hypertension, third trimester: Secondary | ICD-10-CM | POA: Insufficient documentation

## 2020-09-19 DIAGNOSIS — O0990 Supervision of high risk pregnancy, unspecified, unspecified trimester: Secondary | ICD-10-CM | POA: Diagnosis present

## 2020-09-19 DIAGNOSIS — O99213 Obesity complicating pregnancy, third trimester: Secondary | ICD-10-CM

## 2020-09-19 DIAGNOSIS — O133 Gestational [pregnancy-induced] hypertension without significant proteinuria, third trimester: Secondary | ICD-10-CM | POA: Diagnosis not present

## 2020-09-19 DIAGNOSIS — Z6841 Body Mass Index (BMI) 40.0 and over, adult: Secondary | ICD-10-CM

## 2020-09-19 DIAGNOSIS — O99210 Obesity complicating pregnancy, unspecified trimester: Secondary | ICD-10-CM | POA: Insufficient documentation

## 2020-09-19 DIAGNOSIS — Z3A34 34 weeks gestation of pregnancy: Secondary | ICD-10-CM

## 2020-09-19 NOTE — Progress Notes (Signed)
MFM Note  Brittney Lee was seen for an exam today due to recently diagnosed gestational hypertension.  She reports that she was diagnosed with hypertension last week and was started on labetalol 200 mg twice a day and nifedipine 30 mg daily for treatment.  She has ruled out for preeclampsia as her 24-hour urine showed 243 mg of protein.  Her PIH labs were all within normal limits.  The patient denies any signs or symptoms of severe preeclampsia.  Her blood pressures in our office today were 144/94, 156/95, and 148/101.  The patient reports that she has not taken any of her blood pressure medications today.  She reports feeling fetal movements throughout the day.  She was informed that the fetal growth measures in the lower normal range (10th percentile).  There was normal amniotic fluid noted.  Doppler studies of the umbilical arteries performed today showed a normal S/D ratio of 2.97.  There were no signs of absent or reversed end-diastolic flow noted.  The views of the fetal anatomy were limited today due to her advanced gestational age.  Due to her elevated blood pressures, the patient may need to have the dosage of her antihypertensive medications increased.  I would recommend increasing her nifedipine to 60 mg daily.  Preeclampsia precautions were reviewed today.  She was advised to go to the hospital should she complain of any symptoms related to preeclampsia.  Due to gestational hypertension with borderline fetal growth restriction, she should continue weekly nonstress tests and amniotic fluid checks in your office.  Delivery is recommended at around 37 weeks.    No further exams were scheduled in our office.  We will be happy to see her again in the future if necessary.  Recommendations:  Continue labetalol.  Increase nifedipine dosage from 30mg  to 60 mg daily. Weekly nonstress tests and amniotic fluid checks Delivery is recommended at around 37 weeks Delivery will be indicated at  any time should she develop preeclampsia or complaining of any signs or symptoms of preeclampsia  A total of 30 minutes was spent counseling and coordinating the care for this patient.  Greater than 50% of the time was spent in direct face-to-face contact.

## 2020-09-20 ENCOUNTER — Other Ambulatory Visit: Payer: Self-pay

## 2020-09-20 ENCOUNTER — Encounter: Payer: Self-pay | Admitting: Advanced Practice Midwife

## 2020-09-20 ENCOUNTER — Observation Stay
Admission: EM | Admit: 2020-09-20 | Discharge: 2020-09-20 | Disposition: A | Discharge status: Home / Self Care | Payer: BC Managed Care – PPO | Attending: Obstetrics and Gynecology | Admitting: Obstetrics and Gynecology

## 2020-09-20 ENCOUNTER — Encounter: Payer: Self-pay | Admitting: Obstetrics and Gynecology

## 2020-09-20 ENCOUNTER — Ambulatory Visit (INDEPENDENT_AMBULATORY_CARE_PROVIDER_SITE_OTHER): Payer: Medicaid Other | Admitting: Advanced Practice Midwife

## 2020-09-20 VITALS — BP 160/100 | Wt 308.0 lb

## 2020-09-20 DIAGNOSIS — O1493 Unspecified pre-eclampsia, third trimester: Secondary | ICD-10-CM | POA: Diagnosis not present

## 2020-09-20 DIAGNOSIS — O163 Unspecified maternal hypertension, third trimester: Secondary | ICD-10-CM

## 2020-09-20 DIAGNOSIS — Z79899 Other long term (current) drug therapy: Secondary | ICD-10-CM | POA: Insufficient documentation

## 2020-09-20 DIAGNOSIS — O0990 Supervision of high risk pregnancy, unspecified, unspecified trimester: Secondary | ICD-10-CM

## 2020-09-20 DIAGNOSIS — Z3A36 36 weeks gestation of pregnancy: Secondary | ICD-10-CM | POA: Insufficient documentation

## 2020-09-20 DIAGNOSIS — O133 Gestational [pregnancy-induced] hypertension without significant proteinuria, third trimester: Principal | ICD-10-CM | POA: Insufficient documentation

## 2020-09-20 DIAGNOSIS — Z3A34 34 weeks gestation of pregnancy: Secondary | ICD-10-CM

## 2020-09-20 HISTORY — DX: Essential (primary) hypertension: I10

## 2020-09-20 LAB — CBC
HCT: 35.4 % — ABNORMAL LOW (ref 36.0–46.0)
Hemoglobin: 11.9 g/dL — ABNORMAL LOW (ref 12.0–15.0)
MCH: 28.1 pg (ref 26.0–34.0)
MCHC: 33.6 g/dL (ref 30.0–36.0)
MCV: 83.5 fL (ref 80.0–100.0)
Platelets: 256 10*3/uL (ref 150–400)
RBC: 4.24 MIL/uL (ref 3.87–5.11)
RDW: 14.3 % (ref 11.5–15.5)
WBC: 8.1 10*3/uL (ref 4.0–10.5)
nRBC: 0 % (ref 0.0–0.2)

## 2020-09-20 LAB — COMPREHENSIVE METABOLIC PANEL
ALT: 20 U/L (ref 0–44)
AST: 25 U/L (ref 15–41)
Albumin: 2.9 g/dL — ABNORMAL LOW (ref 3.5–5.0)
Alkaline Phosphatase: 88 U/L (ref 38–126)
Anion gap: 8 (ref 5–15)
BUN: 7 mg/dL (ref 6–20)
CO2: 23 mmol/L (ref 22–32)
Calcium: 9 mg/dL (ref 8.9–10.3)
Chloride: 105 mmol/L (ref 98–111)
Creatinine, Ser: 0.51 mg/dL (ref 0.44–1.00)
GFR, Estimated: >60 mL/min (ref >60)
Glucose, Bld: 94 mg/dL (ref 70–99)
Potassium: 3.5 mmol/L (ref 3.5–5.1)
Sodium: 136 mmol/L (ref 135–145)
Total Bilirubin: 0.6 mg/dL (ref 0.3–1.2)
Total Protein: 6.2 g/dL — ABNORMAL LOW (ref 6.5–8.1)

## 2020-09-20 LAB — PROTEIN / CREATININE RATIO, URINE
Creatinine, Urine: 200 mg/dL
Protein Creatinine Ratio: 0.39 mg/mg{Cre} — ABNORMAL HIGH (ref 0.00–0.15)
Total Protein, Urine: 77 mg/dL

## 2020-09-20 MED ORDER — NIFEDIPINE ER OSMOTIC RELEASE 60 MG PO TB24: 60.0000 mg | ORAL_TABLET | Freq: Every day | ORAL | 1 refills | Status: DC

## 2020-09-20 MED ORDER — ACETAMINOPHEN 325 MG PO TABS
650.0000 mg | ORAL_TABLET | ORAL | Status: DC | PRN
  Administered 2020-09-20: 650 mg via ORAL
  Filled 2020-09-20: qty 2

## 2020-09-20 NOTE — OB Triage Note (Signed)
Elevated blood pressure in OB office today. Currently c/o HA 4 out of 10 pain scale. Has not taken anything for HA.  Denies epigastric pain, denies visual disturbances. +2 reflexes, mild pitting edema in both lower extremities. Brittney Lee

## 2020-09-20 NOTE — Progress Notes (Signed)
Routine Prenatal Care Visit  Subjective  Brittney Lee is a 35 y.o. G3P2002 at [redacted]w[redacted]d being seen today for ongoing prenatal care.  She is currently monitored for the following issues for this high-risk pregnancy and has Supervision of high risk pregnancy, antepartum; Maternal obesity, antepartum; Elevated blood pressure complicating pregnancy, antepartum, third trimester; Labor and delivery, indication for care; [redacted] weeks gestation of pregnancy; Gestational hypertension, third trimester; and Elevated blood pressure affecting pregnancy in third trimester, antepartum on their problem list.  ----------------------------------------------------------------------------------- Patient reports headache.  She denies visual changes or epigastric pain. We discussed Dr Loretta Plume recommendations. She is frustrated with medication changes, repeat testing, etc.  Contractions: Not present. Vag. Bleeding: None.  Movement: Present. Leaking Fluid denies.  ----------------------------------------------------------------------------------- The following portions of the patient's history were reviewed and updated as appropriate: allergies, current medications, past family history, past medical history, past social history, past surgical history and problem list. Problem list updated.  Objective  Blood pressure (!) 160/100, weight (!) 308 lb (139.7 kg), last menstrual period 01/24/2020. Pregravid weight 273 lb (123.8 kg) Total Weight Gain 35 lb (15.9 kg) Urinalysis: Urine Protein    Urine Glucose     Repeat BP: 170/110  Fetal Status: Fetal Heart Rate (bpm): 135   Movement: Present      NST: reactive 20 minute tracing, 135 bpm, moderate variability, +accelerations, -decelerations  General:  Alert, oriented and cooperative. Patient is in no acute distress.  Skin: Skin is warm and dry. No rash noted.   Cardiovascular: Normal heart rate noted  Respiratory: Normal respiratory effort, no problems with respiration noted   Abdomen: Soft, gravid, appropriate for gestational age. Pain/Pressure: Absent     Pelvic:  Cervical exam deferred        Extremities: Normal range of motion.  Edema: Mild pitting, slight indentation  Mental Status: Normal mood and affect. Normal behavior. Normal judgment and thought content.   Assessment   35 y.o. [id number] at [redacted]w[redacted]d by  10/30/2020, by Last Menstrual Period presenting for routine prenatal visit  Plan   pregnancy 3 Problems (from 04/04/20 to present)    Problem Noted Resolved   Gestational hypertension, third trimester 09/06/2020 by Tresea Mall, CNM No   Supervision of high risk pregnancy, antepartum 04/22/2020 by Vena Austria, MD No   Overview Addendum 08/08/2020 11:17 AM by Zipporah Plants, CNM     Nursing Staff Provider  Office Location  [facility] Dating   LMP = 12 week [place]  Language  English Anatomy US   complete, normal  Flu Vaccine   declined Genetic Screen  NIPS:  Normal XY  TDaP vaccine   08/08/20 Hgb A1C or  GTT Early: 1h: 173 - repeat 1h: 155;  Third trimester: 3h at 26w1 - 74/139/138/71  Rhogam   n/a   LAB RESULTS   Feeding Plan  formula Blood Type AB/Positive/-- (12/16 1637)   Contraception  BTL Antibody Negative (12/16 1637)  Circumcision  Rubella 14.50 (12/16 1637)  Pediatrician   RPR Non Reactive (04/07 1238)   Support Person  Greig Castilla HBsAg Negative (12/16 1637)   Prenatal Classes  HIV Non Reactive (04/07 1238)    Varicella  immune  BTL Consent  08/08/20 GBS  (For PCN allergy, check sensitivities)        VBAC Consent  n/a Pap  ASCUS HPV negative 04/04/2020    Hgb Electro      CF      SMA  Previous Version       Preterm labor symptoms and general obstetric precautions including but not limited to vaginal bleeding, contractions, leaking of fluid and fetal movement were reviewed in detail with the patient.  Go to L&D for [facility] evaluation now Increase Procardia to 60 mg XL Weekly NST/AFI/MD visits Delivery at 37 weeks or sooner as  needed   Return in about 1 week (around 09/27/2020) for nst/afi/rob/MD ONLY.  Tresea Mall, CNM 09/20/2020 4:33 PM

## 2020-09-20 NOTE — Discharge Instructions (Signed)
Preeclampsia and Eclampsia Preeclampsia is a serious condition that may develop during pregnancy. This condition involves high blood pressure during pregnancy and causes symptoms such as headaches, vision changes, and increased swelling in the legs, hands, and face. Preeclampsia occurs after 20 weeks of pregnancy. Eclampsia is a seizure that happens from worsening preeclampsia. Diagnosing and managing preeclampsia early is important. If not treated early, it can cause serious problems for mother and baby. There is no cure for this condition. However, during pregnancy, delivering the baby may be the best treatment for preeclampsia or eclampsia. For most women, symptoms of preeclampsia and eclampsia go away after giving birth. In rare cases, a woman may develop preeclampsia or eclampsia after giving birth. This usually occurs within 48 hours after childbirth but may occur up to 6 weeks after giving birth. What are the causes? The cause of this condition is not known. What increases the risk? The following factors make you more likely to develop preeclampsia:  Being pregnant for the first time or being pregnant with multiples.  Having had preeclampsia or a condition called hemolysis, elevated liver enzymes, and low platelet count (HELLP)syndrome during a past pregnancy.  Having a family history of preeclampsia.  Being older than age 35.  Being obese.  Becoming pregnant through fertility treatments. Conditions that reduce blood flow or oxygen to your placenta and baby may also increase your risk. These include:  High blood pressure before, during, or immediately following pregnancy.  Kidney disease.  Diabetes.  Blood clotting disorders.  Autoimmune diseases, such as lupus.  Sleep apnea. What are the signs or symptoms? Common symptoms of this condition include:  A severe, throbbing headache that does not go away.  Vision problems, such as blurred or double vision and light  sensitivity.  Pain in the stomach, especially the right upper region.  Pain in the shoulder. Other symptoms that may develop as the condition gets worse include:  Sudden weight gain because of fluid buildup in the body. This causes swelling of the face, hands, legs, and feet.  Severe nausea and vomiting.  Urinating less than usual.  Shortness of breath.  Seizures. How is this diagnosed? Your health care provider will ask you about symptoms and check for signs of preeclampsia during your prenatal visits. You will also have routine tests, including:  Checking your blood pressure.  Urine tests to check for protein.  Blood tests to assess your organ function.  Monitoring your baby's heart rate.  Ultrasounds to check fetal growth.   How is this treated? You and your health care provider will determine the treatment that is best for you. Treatment may include:  Frequent prenatal visits to check for preeclampsia.  Medicine to lower your blood pressure.  Medicine to prevent seizures.  Low-dose aspirin during your pregnancy.  Staying in the hospital, in severe cases. You will be given medicines to control your blood pressure and the amount of fluids in your body.  Delivering your baby. Work with your health care provider to manage any chronic health conditions, such as diabetes or kidney problems. Also, work with your health care provider to manage weight gain during pregnancy. Follow these instructions at home: Eating and drinking  Drink enough fluid to keep your urine pale yellow.  Avoid caffeine. Caffeine may increase blood pressure and heart rate and lead to dehydration.  Reduce the amount of salt that you eat. Lifestyle  Do not use any products that contain nicotine or tobacco. These products include cigarettes, chewing tobacco, and   vaping devices, such as e-cigarettes. If you need help quitting, ask your health care provider.  Do not use alcohol or drugs.  Avoid  stress as much as possible.  Rest and get plenty of sleep. General instructions  Take over-the-counter and prescription medicines only as told by your health care provider.  When lying down, lie on your left side. This keeps pressure off your major blood vessels.  When sitting or lying down, raise (elevate) your feet. Try putting pillows underneath your lower legs.  Exercise regularly. Ask your health care provider what kinds of exercise are best for you.  Check your blood pressure as often as recommended by your health care provider.  Keep all prenatal and follow-up visits. This is important.   Contact a health care provider if:  You have symptoms that may need treatment or closer monitoring. These include: ? Headaches. ? Stomach pain or nausea and vomiting. ? Shoulder pain. ? Vision problems, such as spots in front of your eyes or blurry vision. ? Sudden weight gain or increased swelling in your face, hands, legs, and feet. ? Increased anxiety or feeling of impending doom. ? Signs or symptoms of labor. Get help right away if:  You have any of the following symptoms: ? A seizure. ? Shortness of breath or trouble breathing. ? Trouble speaking or slurred speech. ? Fainting. ? Chest pain. These symptoms may represent a serious problem that is an emergency. Do not wait to see if the symptoms will go away. Get medical help right away. Call your local emergency services (911 in the [place] Do not drive yourself to the hospital. Summary  Preeclampsia is a serious condition that may develop during pregnancy.  Diagnosing and treating preeclampsia early is very important.  Keep all prenatal and follow-up visits. This is important.  Get help right away if you have a seizure, shortness of breath or trouble breathing, trouble speaking or slurred speech, chest pain, or fainting. This information is not intended to replace advice given to you by your health care provider. Make sure you  discuss any questions you have with your health care provider. Document Revised: 12/28/2019 Document Reviewed: 12/28/2019 Elsevier Patient Education  2021 [employer] Inc[employer]  

## 2020-09-20 NOTE — Discharge Summary (Signed)
Physician Discharge Summary  Patient ID: Brittney Lee MRN: [id number] DOB/AGE: 11-02-1985 35 y.o.  Admit date: 09/20/2020 Discharge date: 09/20/2020  Admission Diagnoses:  Discharge Diagnoses:  Active Problems:   Indication for care in labor or delivery   Discharged Condition: good  Hospital Course: She presents today from the office for elevated blood pressures.  She has been diagnosed with gestational hypertension this pregnancy and has been taking 200 of labetalol in the morning 30 mg of Procardia at night.  She was seen by MFM yesterday.  Ultrasound showed a EFW of 20%.  They recommended increasing her Procardia to 60 mg, weekly NSTs and AFI.  She had a headache upon presentation but this resolved with taking 650 mg of Tylenol. She took 200mg  of labetalol this morning and 30 mg or Procardia last night. She will plan to increase her Procardia dose to 60 mg tonight.   Lab evaluation was performed and patient was noted to have an elevated protein creatinine ratio.  For this reason we discussed that she now has a diagnosis of preeclampsia with moderate range blood pressures.  We discussed that with the diagnosis of preeclampsia with moderate blood pressures delivery would be recommended at 37 weeks.  We also discussed that if she is having severe range blood pressures or severe features such as headache increased creatinine or elevated liver enzymes or low platelets that delivery would be indicated sooner.  We discussed that we would try to prolong the pregnancy to 37 weeks if possible.   We discussed options for administration of betamethasone given the possibility of preterm delivery with the new diagnosis of preeclampsia. She declined betamethasone today, but may consider next week.We discussed overnight monitoring of her BP, but she declined today.   Offered to take her out of work, but she declined today. 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   Close follow up next week in office. Will need twice weekly visits with once a week NST and AFI.   Advised she should return if she is having a headache not relieved by tylenol, SOB, vision changes, chest pain, or RUQ pain.   NST: 120 bpm baseline, moderate variability, no accelerations, no decelerations. Reactive  Consults: None  Significant Diagnostic Studies: labs: see EPIC  Treatments: tylenol  Discharge Exam: Blood pressure (!) 147/73, pulse 76, height 5\' 7"  (1.702 m), weight (!) 139.7 kg, last menstrual period 01/24/2020. General appearance: alert and cooperative Resp: clear to auscultation bilaterally Cardio: regular rate and rhythm, S1, S2 normal, no murmur, click, rub or gallop Extremities: extremities normal, atraumatic, no cyanosis or edema Skin: Skin color, texture, turgor normal. No rashes or lesions  Disposition: Discharge disposition: 01-Home or Self Care        Allergies as of 09/20/2020   No Known Allergies     Medication List    TAKE these medications   Cetirizine HCl 10 MG Caps Take by mouth.   esomeprazole 20 MG capsule Commonly known as: NexIUM Take 1 capsule (20 mg total) by mouth daily.   labetalol 200 MG tablet Commonly known as: NORMODYNE Take 1 tablet (200 mg total) by mouth 2 (two) times daily.   NIFEdipine 60 MG 24 hr tablet Commonly known as: PROCARDIA XL/NIFEDICAL XL Take 1 tablet (60 mg total) by mouth daily.   PrePLUS 27-1 MG Tabs       Follow-up Information    Roland Prine R,  MD. Schedule an appointment as soon as possible for a visit in 3 day(s).   Specialty: Obstetrics and Gynecology Contact information: [address] [place] [address] [phone]               Signed: Natale Milch 09/20/2020, 7:29 PM

## 2020-09-23 ENCOUNTER — Encounter: Payer: Self-pay | Admitting: Obstetrics and Gynecology

## 2020-09-23 ENCOUNTER — Encounter: Payer: Self-pay | Admitting: Obstetrics & Gynecology

## 2020-09-23 ENCOUNTER — Ambulatory Visit (INDEPENDENT_AMBULATORY_CARE_PROVIDER_SITE_OTHER): Payer: BC Managed Care – PPO | Admitting: Obstetrics and Gynecology

## 2020-09-23 ENCOUNTER — Observation Stay
Admission: EM | Admit: 2020-09-23 | Discharge: 2020-09-23 | Disposition: A | Discharge status: Home / Self Care | Payer: BC Managed Care – PPO | Attending: Obstetrics & Gynecology | Admitting: Obstetrics & Gynecology

## 2020-09-23 ENCOUNTER — Other Ambulatory Visit: Payer: Self-pay

## 2020-09-23 VITALS — BP 158/80 | Ht 67.0 in | Wt 308.8 lb

## 2020-09-23 DIAGNOSIS — O368330 Maternal care for abnormalities of the fetal heart rate or rhythm, third trimester, not applicable or unspecified: Secondary | ICD-10-CM | POA: Diagnosis not present

## 2020-09-23 DIAGNOSIS — O0990 Supervision of high risk pregnancy, unspecified, unspecified trimester: Secondary | ICD-10-CM

## 2020-09-23 DIAGNOSIS — E669 Obesity, unspecified: Secondary | ICD-10-CM | POA: Diagnosis not present

## 2020-09-23 DIAGNOSIS — Z3A34 34 weeks gestation of pregnancy: Secondary | ICD-10-CM | POA: Diagnosis not present

## 2020-09-23 DIAGNOSIS — O133 Gestational [pregnancy-induced] hypertension without significant proteinuria, third trimester: Principal | ICD-10-CM | POA: Insufficient documentation

## 2020-09-23 DIAGNOSIS — O99213 Obesity complicating pregnancy, third trimester: Secondary | ICD-10-CM | POA: Diagnosis not present

## 2020-09-23 DIAGNOSIS — O163 Unspecified maternal hypertension, third trimester: Secondary | ICD-10-CM

## 2020-09-23 DIAGNOSIS — Z3490 Encounter for supervision of normal pregnancy, unspecified, unspecified trimester: Secondary | ICD-10-CM

## 2020-09-23 DIAGNOSIS — O1493 Unspecified pre-eclampsia, third trimester: Secondary | ICD-10-CM

## 2020-09-23 MED ORDER — BETAMETHASONE SOD PHOS & ACET 6 (3-3) MG/ML IJ SUSP
12.0000 mg | INTRAMUSCULAR | Status: DC
  Administered 2020-09-23: 12 mg via INTRAMUSCULAR
  Filled 2020-09-23: qty 5

## 2020-09-23 MED ORDER — NIFEDIPINE ER OSMOTIC RELEASE 90 MG PO TB24: 90.0000 mg | ORAL_TABLET | Freq: Every day | ORAL | 3 refills | Status: DC

## 2020-09-23 NOTE — Progress Notes (Signed)
Routine Prenatal Care Visit  Subjective  Brittney Lee is a 35 y.o. G3P2002 at [redacted]w[redacted]d being seen today for ongoing prenatal care.  She is currently monitored for the following issues for this high-risk pregnancy and has Supervision of high risk pregnancy, antepartum; Maternal obesity, antepartum; Elevated blood pressure complicating pregnancy, antepartum, third trimester; Labor and delivery, indication for care; [redacted] weeks gestation of pregnancy; Gestational hypertension, third trimester; Elevated blood pressure affecting pregnancy in third trimester, antepartum; and Indication for care in labor or delivery on their problem list.  ----------------------------------------------------------------------------------- Patient reports no complaints.  She denies headaches vision changes or right upper quadrant pain.  She has a small amount of swelling in her feet.  She reports that she discontinued the a.m. labetalol because is making her feel funny.  She changed her Procardia XL 60 mg from a evening dose to a morning dose.  She took her Procardia this morning.  She is in agreement with receiving betamethasone today.  Ideally she would like to continue at work until Thursday since her job is closing.  Her children finish school on Wednesday. Contractions: Not present. Vag. Bleeding: None.  Movement: Present. Denies leaking of fluid.  ----------------------------------------------------------------------------------- The following portions of the patient's history were reviewed and updated as appropriate: allergies, current medications, past family history, past medical history, past social history, past surgical history and problem list. Problem list updated.   Objective  Blood pressure (!) 158/80, height 5\' 7"  (1.702 m), weight (!) 308 lb 12.8 oz (140.1 kg), last menstrual period 01/24/2020. Pregravid weight 273 lb (123.8 kg) Total Weight Gain 35 lb 12.8 oz (16.2 kg) Urinalysis:      Fetal Status:  Fetal Heart Rate (bpm): 135   Movement: Present     General:  Alert, oriented and cooperative. Patient is in no acute distress.  Skin: Skin is warm and dry. No rash noted.   Cardiovascular: Normal heart rate noted  Respiratory: Normal respiratory effort, no problems with respiration noted  Abdomen: Soft, gravid, appropriate for gestational age. Pain/Pressure: Absent     Pelvic:  Cervical exam deferred        Extremities: Normal range of motion.  Edema: Mild pitting, slight indentation  Mental Status: Normal mood and affect. Normal behavior. Normal judgment and thought content.     Assessment   35 y.o. 33 at [redacted]w[redacted]d by  10/30/2020, by Last Menstrual Period presenting for routine prenatal visit  Plan   pregnancy 3 Problems (from 04/04/20 to present)    Problem Noted Resolved   Gestational hypertension, third trimester 09/06/2020 by 09/08/2020, CNM No   Supervision of high risk pregnancy, antepartum 04/22/2020 by 06/20/2020, MD No   Overview Addendum 09/23/2020  5:33 PM by 11/23/2020, MD     Nursing Staff Provider  Office Location  [facility] Dating   LMP = 12 week Natale Milch  Language  English Anatomy US   complete, normal  Flu Vaccine   declined Genetic Screen  NIPS:  Normal XY  TDaP vaccine   08/08/20 Hgb A1C or  GTT Early: 1h: 173 - repeat 1h: 155;  Third trimester: 3h at 26w1 - 74/139/138/71  Rhogam   n/a   LAB RESULTS   Feeding Plan  formula Blood Type AB/Positive/-- (12/16 1637)   Contraception  BTL Antibody Negative (12/16 1637)  Circumcision  Rubella 14.50 (12/16 1637)  Pediatrician   RPR Non Reactive (04/07 1238)   Support Person  03-08-1998 HBsAg Negative (12/16 1637)   Prenatal  Classes  HIV Non Reactive (04/07 1238)    Varicella  immune  BTL Consent  08/08/20 GBS  (For PCN allergy, check sensitivities)        VBAC Consent  n/a Pap  ASCUS HPV negative 04/04/2020    Hgb Electro      CF      SMA          Preeclampsia with moderate blood pressures BMI  pregravid: 42 Low-normal EFW 10%       Previous Version       Sent to L&D for betamethasone and BP monitoring/labs Discussed care with Dr. Tiburcio Pea Rx for 90 mg Procardia sent  Gestational age appropriate obstetric precautions including but not limited to vaginal bleeding, contractions, leaking of fluid and fetal movement were reviewed in detail with the patient.    Return in about 4 days (around 09/27/2020) for ROB visit with MD.  Natale Milch MD [facility], [facility] 09/23/2020, 5:33 PM

## 2020-09-23 NOTE — OB Triage Note (Addendum)
Pt presented to L/D triage from office with reported elevated Bps in the office.Pt reports no headache, vision changes, atypical edema, or urinary issues. +2 reflexes; no clonus noted. Pt reports no bleeding, pain, or LOF. Pt reports positive fetal movement. Monitors applied and assessing-FHT 138. Initial BP 122/62-cycling q72min.

## 2020-09-23 NOTE — Patient Instructions (Signed)
Preeclampsia and Eclampsia Preeclampsia is a serious condition that may develop during pregnancy. This condition involves high blood pressure during pregnancy and causes symptoms such as headaches, vision changes, and increased swelling in the legs, hands, and face. Preeclampsia occurs after 20 weeks of pregnancy. Eclampsia is a seizure that happens from worsening preeclampsia. Diagnosing and managing preeclampsia early is important. If not treated early, it can cause serious problems for mother and baby. There is no cure for this condition. However, during pregnancy, delivering the baby may be the best treatment for preeclampsia or eclampsia. For most women, symptoms of preeclampsia and eclampsia go away after giving birth. In rare cases, a woman may develop preeclampsia or eclampsia after giving birth. This usually occurs within 48 hours after childbirth but may occur up to 6 weeks after giving birth. What are the causes? The cause of this condition is not known. What increases the risk? The following factors make you more likely to develop preeclampsia:  Being pregnant for the first time or being pregnant with multiples.  Having had preeclampsia or a condition called hemolysis, elevated liver enzymes, and low platelet count (HELLP)syndrome during a past pregnancy.  Having a family history of preeclampsia.  Being older than age 35.  Being obese.  Becoming pregnant through fertility treatments. Conditions that reduce blood flow or oxygen to your placenta and baby may also increase your risk. These include:  High blood pressure before, during, or immediately following pregnancy.  Kidney disease.  Diabetes.  Blood clotting disorders.  Autoimmune diseases, such as lupus.  Sleep apnea. What are the signs or symptoms? Common symptoms of this condition include:  A severe, throbbing headache that does not go away.  Vision problems, such as blurred or double vision and light  sensitivity.  Pain in the stomach, especially the right upper region.  Pain in the shoulder. Other symptoms that may develop as the condition gets worse include:  Sudden weight gain because of fluid buildup in the body. This causes swelling of the face, hands, legs, and feet.  Severe nausea and vomiting.  Urinating less than usual.  Shortness of breath.  Seizures. How is this diagnosed? Your health care provider will ask you about symptoms and check for signs of preeclampsia during your prenatal visits. You will also have routine tests, including:  Checking your blood pressure.  Urine tests to check for protein.  Blood tests to assess your organ function.  Monitoring your baby's heart rate.  Ultrasounds to check fetal growth.   How is this treated? You and your health care provider will determine the treatment that is best for you. Treatment may include:  Frequent prenatal visits to check for preeclampsia.  Medicine to lower your blood pressure.  Medicine to prevent seizures.  Low-dose aspirin during your pregnancy.  Staying in the hospital, in severe cases. You will be given medicines to control your blood pressure and the amount of fluids in your body.  Delivering your baby. Work with your health care provider to manage any chronic health conditions, such as diabetes or kidney problems. Also, work with your health care provider to manage weight gain during pregnancy. Follow these instructions at home: Eating and drinking  Drink enough fluid to keep your urine pale yellow.  Avoid caffeine. Caffeine may increase blood pressure and heart rate and lead to dehydration.  Reduce the amount of salt that you eat. Lifestyle  Do not use any products that contain nicotine or tobacco. These products include cigarettes, chewing tobacco, and   vaping devices, such as e-cigarettes. If you need help quitting, ask your health care provider.  Do not use alcohol or drugs.  Avoid  stress as much as possible.  Rest and get plenty of sleep. General instructions  Take over-the-counter and prescription medicines only as told by your health care provider.  When lying down, lie on your left side. This keeps pressure off your major blood vessels.  When sitting or lying down, raise (elevate) your feet. Try putting pillows underneath your lower legs.  Exercise regularly. Ask your health care provider what kinds of exercise are best for you.  Check your blood pressure as often as recommended by your health care provider.  Keep all prenatal and follow-up visits. This is important.   Contact a health care provider if:  You have symptoms that may need treatment or closer monitoring. These include: ? Headaches. ? Stomach pain or nausea and vomiting. ? Shoulder pain. ? Vision problems, such as spots in front of your eyes or blurry vision. ? Sudden weight gain or increased swelling in your face, hands, legs, and feet. ? Increased anxiety or feeling of impending doom. ? Signs or symptoms of labor. Get help right away if:  You have any of the following symptoms: ? A seizure. ? Shortness of breath or trouble breathing. ? Trouble speaking or slurred speech. ? Fainting. ? Chest pain. These symptoms may represent a serious problem that is an emergency. Do not wait to see if the symptoms will go away. Get medical help right away. Call your local emergency services (911 in the [place] Do not drive yourself to the hospital. Summary  Preeclampsia is a serious condition that may develop during pregnancy.  Diagnosing and treating preeclampsia early is very important.  Keep all prenatal and follow-up visits. This is important.  Get help right away if you have a seizure, shortness of breath or trouble breathing, trouble speaking or slurred speech, chest pain, or fainting. This information is not intended to replace advice given to you by your health care provider. Make sure you  discuss any questions you have with your health care provider. Document Revised: 12/28/2019 Document Reviewed: 12/28/2019 Elsevier Patient Education  2021 Elsevier Inc.  

## 2020-09-23 NOTE — OB Triage Note (Signed)
Pt discharged home in stable condition per MD. Discharge instructions given-pt verbalized understanding. Pt to return to L&D tomorrow at 1830 for second dose of betamethasone. Will follow up with office.

## 2020-09-24 ENCOUNTER — Observation Stay
Admission: EM | Admit: 2020-09-24 | Discharge: 2020-09-24 | Disposition: A | Discharge status: Home / Self Care | Payer: BC Managed Care – PPO | Attending: Obstetrics and Gynecology | Admitting: Obstetrics and Gynecology

## 2020-09-24 DIAGNOSIS — Z3A34 34 weeks gestation of pregnancy: Secondary | ICD-10-CM | POA: Insufficient documentation

## 2020-09-24 DIAGNOSIS — Z298 Encounter for other specified prophylactic measures: Secondary | ICD-10-CM | POA: Diagnosis not present

## 2020-09-24 MED ORDER — BETAMETHASONE SOD PHOS & ACET 6 (3-3) MG/ML IJ SUSP
12.0000 mg | Freq: Once | INTRAMUSCULAR | Status: AC
  Administered 2020-09-24: 12 mg via INTRAMUSCULAR

## 2020-09-24 NOTE — Discharge Summary (Signed)
Patient presents for 2nd betamethasone shot today. No complaints.  Shot administered by [employer]

## 2020-09-24 NOTE — Discharge Summary (Signed)
  See FPN 

## 2020-09-24 NOTE — Final Progress Note (Signed)
Physician Final Progress Note  Patient ID: Brittney Lee MRN: [id number] DOB/AGE: 1985/09/28 35 y.o.  Admit date: 09/23/2020 Admitting provider: Nadara Mustard, MD Discharge date: 09/24/2020  Admission Diagnoses: Hypertension in pregnancy, 33 weeks, Obesity in pregnancy  Discharge Diagnoses:   Same  Consults: None  Significant Findings/ Diagnostic Studies: Patient presented for evaluation of recent elevated BP in pregnancy.  She has been started on procardia, recent increase in dose.  She has taken sporadically.  Denies headache, CP, SOB, visual changes, epigastric pain today.  Noted to have elevated BP in office today (normal serial BP measurements here).  Patient had FHR assessment (R NST) and serial BP exam by RN and this was reported to me. I reviewed her vital signs and fetal tracing, both of which were reassuring.  BMZ given for fetal lung maturity as likely will need earlier delivery if HTN worsens.  Second dose due tomorrow.  Patient was discharge as she was not laboring and no signs of worsening preeclampsia.  Procedures: A NST procedure was performed with FHR monitoring and a normal baseline established, appropriate time of 20-40 minutes of evaluation, and accels >2 seen w 15x15 characteristics.  Results show a REACTIVE NST.   Discharge Condition: good  Disposition: Discharge disposition: 01-Home or Self Care       Diet: Regular diet  Discharge Activity: Activity as tolerated   Allergies as of 09/23/2020   No Known Allergies     Medication List    ASK your doctor about these medications   Cetirizine HCl 10 MG Caps Take by mouth.   esomeprazole 20 MG capsule Commonly known as: NexIUM Take 1 capsule (20 mg total) by mouth daily.   labetalol 200 MG tablet Commonly known as: NORMODYNE Take 1 tablet (200 mg total) by mouth 2 (two) times daily.   NIFEdipine 90 MG 24 hr tablet Commonly known as: Procardia XL Take 1 tablet (90 mg total) by mouth daily.    PrePLUS 27-1 MG Tabs        Total time spent taking care of this patient: TRIAGE  Signed: Letitia Libra 09/24/2020, 12:03 AM

## 2020-09-24 NOTE — Progress Notes (Signed)
Patient discharged to home with labor precautions and encouraged to keep all remaining OB visits. Patient left unit ambulatory with all personal belongings including clothing, cell phone and purse.

## 2020-09-24 NOTE — OB Triage Note (Signed)
Patient arrived to L&D Triage unit for second dose of BMZ 12 mg IM. Patient reports she got first dose on 09/23/20. Patient is G3P2 [redacted]w[redacted]d. PAtient reports active fetal movement. Patient reports no symptoms consistent with active vaginal bleeding or LOF.   51- Dr Pervis Hocking and give telephone order to give second dose of BMZ and patient can be discharged to home.

## 2020-09-27 ENCOUNTER — Encounter: Payer: Self-pay | Admitting: Obstetrics and Gynecology

## 2020-09-27 ENCOUNTER — Ambulatory Visit (INDEPENDENT_AMBULATORY_CARE_PROVIDER_SITE_OTHER): Payer: BC Managed Care – PPO | Admitting: Obstetrics and Gynecology

## 2020-09-27 ENCOUNTER — Inpatient Hospital Stay
Admission: EM | Admit: 2020-09-27 | Discharge: 2020-10-02 | DRG: 784 | Disposition: A | Discharge status: Home / Self Care | Payer: BC Managed Care – PPO | Attending: Obstetrics & Gynecology | Admitting: Obstetrics & Gynecology

## 2020-09-27 ENCOUNTER — Other Ambulatory Visit: Payer: Self-pay

## 2020-09-27 VITALS — BP 160/80 | Wt 313.0 lb

## 2020-09-27 DIAGNOSIS — O0990 Supervision of high risk pregnancy, unspecified, unspecified trimester: Secondary | ICD-10-CM

## 2020-09-27 DIAGNOSIS — O99210 Obesity complicating pregnancy, unspecified trimester: Secondary | ICD-10-CM | POA: Diagnosis present

## 2020-09-27 DIAGNOSIS — O1490 Unspecified pre-eclampsia, unspecified trimester: Secondary | ICD-10-CM | POA: Diagnosis present

## 2020-09-27 DIAGNOSIS — O1493 Unspecified pre-eclampsia, third trimester: Secondary | ICD-10-CM

## 2020-09-27 DIAGNOSIS — Z302 Encounter for sterilization: Secondary | ICD-10-CM

## 2020-09-27 DIAGNOSIS — O99214 Obesity complicating childbirth: Secondary | ICD-10-CM | POA: Diagnosis present

## 2020-09-27 DIAGNOSIS — O133 Gestational [pregnancy-induced] hypertension without significant proteinuria, third trimester: Secondary | ICD-10-CM

## 2020-09-27 DIAGNOSIS — O1414 Severe pre-eclampsia complicating childbirth: Principal | ICD-10-CM | POA: Diagnosis present

## 2020-09-27 DIAGNOSIS — D62 Acute posthemorrhagic anemia: Secondary | ICD-10-CM | POA: Diagnosis not present

## 2020-09-27 DIAGNOSIS — Z20822 Contact with and (suspected) exposure to covid-19: Secondary | ICD-10-CM | POA: Diagnosis present

## 2020-09-27 DIAGNOSIS — O9081 Anemia of the puerperium: Secondary | ICD-10-CM | POA: Diagnosis not present

## 2020-09-27 DIAGNOSIS — Z87891 Personal history of nicotine dependence: Secondary | ICD-10-CM

## 2020-09-27 DIAGNOSIS — Z3A35 35 weeks gestation of pregnancy: Secondary | ICD-10-CM

## 2020-09-27 DIAGNOSIS — O4103X0 Oligohydramnios, third trimester, not applicable or unspecified: Secondary | ICD-10-CM | POA: Diagnosis present

## 2020-09-27 LAB — COMPREHENSIVE METABOLIC PANEL
ALT: 22 U/L (ref 0–44)
AST: 23 U/L (ref 15–41)
Albumin: 2.9 g/dL — ABNORMAL LOW (ref 3.5–5.0)
Alkaline Phosphatase: 81 U/L (ref 38–126)
Anion gap: 7 (ref 5–15)
BUN: 10 mg/dL (ref 6–20)
CO2: 24 mmol/L (ref 22–32)
Calcium: 9 mg/dL (ref 8.9–10.3)
Chloride: 106 mmol/L (ref 98–111)
Creatinine, Ser: 0.7 mg/dL (ref 0.44–1.00)
GFR, Estimated: >60 mL/min (ref >60)
Glucose, Bld: 103 mg/dL — ABNORMAL HIGH (ref 70–99)
Potassium: 3.7 mmol/L (ref 3.5–5.1)
Sodium: 137 mmol/L (ref 135–145)
Total Bilirubin: 0.6 mg/dL (ref 0.3–1.2)
Total Protein: 6.5 g/dL (ref 6.5–8.1)

## 2020-09-27 LAB — CBC WITH DIFFERENTIAL/PLATELET
Abs Immature Granulocytes: 0.07 10*3/uL (ref 0.00–0.07)
Basophils Absolute: 0 10*3/uL (ref 0.0–0.1)
Basophils Relative: 0 %
Eosinophils Absolute: 0.1 10*3/uL (ref 0.0–0.5)
Eosinophils Relative: 1 %
HCT: 37.9 % (ref 36.0–46.0)
Hemoglobin: 12.5 g/dL (ref 12.0–15.0)
Immature Granulocytes: 1 %
Lymphocytes Relative: 29 %
Lymphs Abs: 3.3 10*3/uL (ref 0.7–4.0)
MCH: 27.5 pg (ref 26.0–34.0)
MCHC: 33 g/dL (ref 30.0–36.0)
MCV: 83.5 fL (ref 80.0–100.0)
Monocytes Absolute: 1.1 10*3/uL — ABNORMAL HIGH (ref 0.1–1.0)
Monocytes Relative: 10 %
Neutro Abs: 6.9 10*3/uL (ref 1.7–7.7)
Neutrophils Relative %: 59 %
Platelets: 281 10*3/uL (ref 150–400)
RBC: 4.54 MIL/uL (ref 3.87–5.11)
RDW: 14.4 % (ref 11.5–15.5)
WBC: 11.4 10*3/uL — ABNORMAL HIGH (ref 4.0–10.5)
nRBC: 0 % (ref 0.0–0.2)

## 2020-09-27 LAB — PROTEIN / CREATININE RATIO, URINE
Creatinine, Urine: 114 mg/dL
Protein Creatinine Ratio: 1.87 mg/mg{Cre} — ABNORMAL HIGH (ref 0.00–0.15)
Total Protein, Urine: 213 mg/dL

## 2020-09-27 LAB — POCT URINALYSIS DIPSTICK OB: Glucose, UA: NEGATIVE

## 2020-09-27 NOTE — Progress Notes (Signed)
Routine Prenatal Care Visit  Subjective  Brittney Lee is a 35 y.o. G3P2002 at [redacted]w[redacted]d being seen today for ongoing prenatal care.  She is currently monitored for the following issues for this high-risk pregnancy and has Supervision of high risk pregnancy, antepartum; Maternal obesity, antepartum; Elevated blood pressure complicating pregnancy, antepartum, third trimester; Labor and delivery, indication for care; [redacted] weeks gestation of pregnancy; Gestational hypertension, third trimester; Elevated blood pressure affecting pregnancy in third trimester, antepartum; Indication for care in labor or delivery; and Pregnancy on their problem list.  ----------------------------------------------------------------------------------- Patient reports no complaints.  Does report increased lower extremity edema.  No headaches, vision changes, RUQ.  Did have a headache earlier this week relieved by Tylenol Contractions: Not present. Vag. Bleeding: None.  Movement: Present. Denies leaking of fluid.  ----------------------------------------------------------------------------------- The following portions of the patient's history were reviewed and updated as appropriate: allergies, current medications, past family history, past medical history, past social history, past surgical history and problem list. Problem list updated.   Objective  Weight (!) 313 lb (142 kg), last menstrual period 01/24/2020. Pregravid weight 273 lb (123.8 kg) Total Weight Gain 40 lb (18.1 kg) Urinalysis:      Fetal Status: Fetal Heart Rate (bpm): 135   Movement: Present  Presentation: Vertex  General:  Alert, oriented and cooperative. Patient is in no acute distress.  Skin: Skin is warm and dry. No rash noted.   Cardiovascular: Normal heart rate noted  Respiratory: Normal respiratory effort, no problems with respiration noted  Abdomen: Soft, gravid, appropriate for gestational age. Pain/Pressure: Absent     Pelvic:  Cervical  exam deferred        Extremities: Normal range of motion.  Edema: Mild pitting, slight indentation  ental Status: Normal mood and affect. Normal behavior. Normal judgment and thought content.     Assessment   35 y.o. [id number] at [redacted]w[redacted]d by  10/30/2020, by Last Menstrual Period presenting for routine prenatal visit  Plan   pregnancy 3 Problems (from 04/04/20 to present)     Problem Noted Resolved   Gestational hypertension, third trimester 09/06/2020 by Tresea Mall, CNM No   Supervision of high risk pregnancy, antepartum 04/22/2020 by Vena Austria, MD No   Overview Addendum 09/23/2020  5:33 PM by Natale Milch, MD     Nursing Staff Provider  Office Location  [facility] Dating   LMP = 12 week [place]  Language  English Anatomy US   complete, normal  Flu Vaccine   declined Genetic Screen  NIPS:  Normal XY  TDaP vaccine   08/08/20 Hgb A1C or  GTT Early: 1h: 173 - repeat 1h: 155;  Third trimester: 3h at 26w1 - 74/139/138/71  Rhogam   n/a   LAB RESULTS   Feeding Plan  formula Blood Type AB/Positive/-- (12/16 1637)   Contraception  BTL Antibody Negative (12/16 1637)  Circumcision  Rubella 14.50 (12/16 1637)  Pediatrician   RPR Non Reactive (04/07 1238)   Support Person  Greig Castilla HBsAg Negative (12/16 1637)   Prenatal Classes  HIV Non Reactive (04/07 1238)    Varicella  immune  BTL Consent  08/08/20 GBS  (For PCN allergy, check sensitivities)        VBAC Consent  n/a Pap  ASCUS HPV negative 04/04/2020    Hgb Electro      CF      SMA         Preeclampsia with moderate blood pressures BMI pregravid: 42 Low-normal EFW 10%  Gestational age appropriate obstetric precautions including but not limited to vaginal bleeding, contractions, leaking of fluid and fetal movement were reviewed in detail with the patient.    - start out of work - severe range BP x 2 today - 5lbs weight gain in last 4 days - to L&D for preeclampsia eval  Return in about 4 days (around  10/01/2020) for ROB BP check.  Vena Austria, MD, [facility], [facility] 09/27/2020, 2:32 PM  '

## 2020-09-27 NOTE — OB Triage Note (Signed)
Pt. reports to Labor & Delivery today after being seen in the office for a routine prenatal visit. Pt. sent to L&D by Dr. Bonney Aid after having two severe-range BPs. External [place] and [place] applied. Initial FHR 142bpm. BPs cycling q15 minutes; first BP 158/90. All other vital signs WNL. Urine sent to lab for protein/creatinine ratio. Will call lab to draw for CBC and CMP. Will continue to monitor.

## 2020-09-27 NOTE — Progress Notes (Signed)
ROB - no concerns. RM 5 

## 2020-09-27 NOTE — H&P (Signed)
Brittney Lee is a 35 y.o. female presenting for elevated blood pressures and workup for pre eclampsia. She was seen in the office today and had two pressures of :162/80 and 160/80. She has been seen on the unit for several work ups related to her gestational hypertension. Today she denies any headache, blurred vision or chest pain. Her baby has been moving well. She denies any contractions OB History     Gravida  3   Para  2   Term  2   Preterm      AB      Living  2      SAB      IAB      Ectopic      Multiple      Live Births  2          Past Medical History:  Diagnosis Date   GERD (gastroesophageal reflux disease)    Hypertension    Seasonal allergies    Past Surgical History:  Procedure Laterality Date   NO PAST SURGERIES     Family History: family history includes Diabetes in her mother. Social History:  reports that she has quit smoking. She has never used smokeless tobacco. She reports previous alcohol use. She reports previous drug use.     Maternal Diabetes: No Genetic Screening: Normal Maternal Ultrasounds/Referrals: Normal Fetal Ultrasounds or other Referrals:  Referred to Materal Fetal Medicine  Maternal Substance Abuse:  No Significant Maternal Medications:  Meds include: Other:  Procardia and labetalol Significant Maternal Lab Results:  Other: UPC: 1.87 Other Comments:  None  Review of Systems  Constitutional: Negative.   HENT: Negative.    Eyes: Negative.   Respiratory: Negative.    Cardiovascular:  Positive for leg swelling.       Pitting edema2+ No clonus Reflexes 3+  Gastrointestinal:        Gravid abdomen, High BMI  Endocrine: Negative.   Genitourinary: Negative.   Musculoskeletal: Negative.   Allergic/Immunologic: Negative.   Neurological: Negative.   Hematological: Negative.   Psychiatric/Behavioral: Negative.    History   Blood pressure (!) 151/90, pulse 97, resp. rate 20, last menstrual period 01/24/2020. Maternal  Exam:  Uterine Assessment: Contraction strength is mild.  Contraction frequency is rare.  Abdomen: Patient reports no abdominal tenderness. Fetal presentation: vertex Introitus: Normal vulva. Ferning test: not done.  Nitrazine test: not done. Pelvis: adequate for delivery.   Cervix: not evaluated. Notalbe leg and pedal edema. Reflexes 2-3 + , no clonus Physical Exam Constitutional:      Appearance: Normal appearance. She is obese.  HENT:     Head: Normocephalic and atraumatic.     Nose: Nose normal.  Cardiovascular:     Rate and Rhythm: Normal rate and regular rhythm.     Pulses: Normal pulses.     Heart sounds: Normal heart sounds.  Pulmonary:     Effort: Pulmonary effort is normal.     Breath sounds: Normal breath sounds.  Abdominal:     General: Bowel sounds are normal.     Comments: Gravid abdomen Adipose  Genitourinary:    General: Normal vulva.     Comments: SVE deferred Musculoskeletal:        General: Normal range of motion.     Cervical back: Normal range of motion and neck supple.  Skin:    General: Skin is warm and dry.  Neurological:     General: No focal deficit present.     Mental Status:  She is alert and oriented to person, place, and time.  Psychiatric:        Mood and Affect: Mood normal.        Behavior: Behavior normal.    Prenatal labs: ABO, Rh: AB/Positive/-- (12/16 1637) Antibody: Negative (12/16 1637) Rubella: 14.50 (12/16 1637) RPR: Non Reactive (04/07 1238)  HBsAg: Negative (12/16 1637)  HIV: Non Reactive (04/07 1238)  GBS:   unavailable  Assessment/Plan: IUP 35 weeks 2 days High risk pregnancy notable for morbid obesity, gestational hypertension- presently addressed with labetalol and procardia Abnormal UPC ratio. Elevated pressures today Reactive NST- category 1 FHTs  Discussed briefly with Dr. Jean Rosenthal. Will admit for serial blood pressures and consult with Maternal fetal Medicine. POC to be determined post consultation with  MFM.     Brittney Lee 09/27/2020, 5:09 PM

## 2020-09-28 ENCOUNTER — Encounter
Admission: EM | Disposition: A | Discharge status: Home / Self Care | Payer: Self-pay | Source: Home / Self Care | Attending: Obstetrics & Gynecology

## 2020-09-28 DIAGNOSIS — O1490 Unspecified pre-eclampsia, unspecified trimester: Secondary | ICD-10-CM | POA: Diagnosis present

## 2020-09-28 DIAGNOSIS — D62 Acute posthemorrhagic anemia: Secondary | ICD-10-CM | POA: Diagnosis not present

## 2020-09-28 DIAGNOSIS — Z20822 Contact with and (suspected) exposure to covid-19: Secondary | ICD-10-CM | POA: Diagnosis present

## 2020-09-28 DIAGNOSIS — Z302 Encounter for sterilization: Secondary | ICD-10-CM | POA: Diagnosis not present

## 2020-09-28 DIAGNOSIS — O99214 Obesity complicating childbirth: Secondary | ICD-10-CM | POA: Diagnosis present

## 2020-09-28 DIAGNOSIS — R030 Elevated blood-pressure reading, without diagnosis of hypertension: Secondary | ICD-10-CM | POA: Diagnosis present

## 2020-09-28 DIAGNOSIS — O9081 Anemia of the puerperium: Secondary | ICD-10-CM | POA: Diagnosis not present

## 2020-09-28 DIAGNOSIS — O4103X0 Oligohydramnios, third trimester, not applicable or unspecified: Secondary | ICD-10-CM | POA: Diagnosis present

## 2020-09-28 DIAGNOSIS — Z87891 Personal history of nicotine dependence: Secondary | ICD-10-CM | POA: Diagnosis not present

## 2020-09-28 DIAGNOSIS — Z3A35 35 weeks gestation of pregnancy: Secondary | ICD-10-CM | POA: Diagnosis not present

## 2020-09-28 DIAGNOSIS — O1414 Severe pre-eclampsia complicating childbirth: Secondary | ICD-10-CM | POA: Diagnosis present

## 2020-09-28 LAB — TYPE AND SCREEN
ABO/RH(D): AB POS
Antibody Screen: NEGATIVE

## 2020-09-28 LAB — RESP PANEL BY RT-PCR (FLU A&B, COVID) ARPGX2
Influenza A by PCR: NEGATIVE
Influenza B by PCR: NEGATIVE
SARS Coronavirus 2 by RT PCR: NEGATIVE

## 2020-09-28 LAB — GROUP B STREP BY PCR: Group B strep by PCR: NEGATIVE

## 2020-09-28 SURGERY — Surgical Case
Anesthesia: Spinal

## 2020-09-28 MED ORDER — POVIDONE-IODINE 10 % EX SWAB: 2.0000 "application " | Freq: Once | CUTANEOUS | Status: DC

## 2020-09-28 MED ORDER — MISOPROSTOL 25 MCG QUARTER TABLET
25.0000 ug | ORAL_TABLET | ORAL | Status: DC | PRN
  Administered 2020-09-28 (×4): 25 ug via VAGINAL
  Filled 2020-09-28 (×4): qty 1

## 2020-09-28 MED ORDER — SOD CITRATE-CITRIC ACID 500-334 MG/5ML PO SOLN: 30.0000 mL | ORAL | Status: DC

## 2020-09-28 MED ORDER — OXYTOCIN BOLUS FROM INFUSION: 333.0000 mL | Freq: Once | INTRAVENOUS | Status: DC

## 2020-09-28 MED ORDER — ONDANSETRON HCL 4 MG/2ML IJ SOLN: 4.0000 mg | Freq: Four times a day (QID) | INTRAMUSCULAR | Status: DC | PRN

## 2020-09-28 MED ORDER — MAGNESIUM SULFATE BOLUS VIA INFUSION
4.0000 g | Freq: Once | INTRAVENOUS | Status: AC
  Administered 2020-09-28: 4 g via INTRAVENOUS
  Filled 2020-09-28: qty 1000

## 2020-09-28 MED ORDER — LACTATED RINGERS IV SOLN: 500.0000 mL | INTRAVENOUS | Status: DC | PRN

## 2020-09-28 MED ORDER — BUPIVACAINE 0.25 % ON-Q PUMP DUAL CATH 400 ML
400.0000 mL | INJECTION | Status: DC
  Filled 2020-09-28: qty 400

## 2020-09-28 MED ORDER — OXYTOCIN-SODIUM CHLORIDE 30-0.9 UT/500ML-% IV SOLN
2.5000 [IU]/h | INTRAVENOUS | Status: DC
  Administered 2020-09-29: 30 m[IU]/h via INTRAVENOUS
  Filled 2020-09-28 (×2): qty 500

## 2020-09-28 MED ORDER — ACETAMINOPHEN 325 MG PO TABS
650.0000 mg | ORAL_TABLET | Freq: Four times a day (QID) | ORAL | Status: DC | PRN
  Administered 2020-09-28: 650 mg via ORAL
  Filled 2020-09-28: qty 2

## 2020-09-28 MED ORDER — LABETALOL HCL 5 MG/ML IV SOLN
80.0000 mg | INTRAVENOUS | Status: DC | PRN
  Administered 2020-09-28 (×2): 80 mg via INTRAVENOUS
  Filled 2020-09-28 (×2): qty 16

## 2020-09-28 MED ORDER — SODIUM CHLORIDE 0.9 % IV SOLN
2.0000 g | INTRAVENOUS | Status: AC
  Administered 2020-09-29: 2 g via INTRAVENOUS

## 2020-09-28 MED ORDER — CALCIUM GLUCONATE 10 % IV SOLN
INTRAVENOUS | Status: AC
  Filled 2020-09-28: qty 10

## 2020-09-28 MED ORDER — TERBUTALINE SULFATE 1 MG/ML IJ SOLN: 0.2500 mg | Freq: Once | INTRAMUSCULAR | Status: DC | PRN

## 2020-09-28 MED ORDER — MAGNESIUM SULFATE 40 GM/1000ML IV SOLN
2.0000 g/h | INTRAVENOUS | Status: DC
  Administered 2020-09-28 – 2020-09-29 (×2): 2 g/h via INTRAVENOUS
  Filled 2020-09-28 (×2): qty 1000

## 2020-09-28 MED ORDER — BUPIVACAINE HCL 0.5 % IJ SOLN
10.0000 mL | Freq: Once | INTRAMUSCULAR | Status: DC
  Filled 2020-09-28: qty 10

## 2020-09-28 MED ORDER — HYDRALAZINE HCL 20 MG/ML IJ SOLN
10.0000 mg | INTRAMUSCULAR | Status: DC | PRN
  Administered 2020-09-28: 10 mg via INTRAVENOUS
  Filled 2020-09-28: qty 1

## 2020-09-28 MED ORDER — LIDOCAINE HCL (PF) 1 % IJ SOLN: 30.0000 mL | INTRAMUSCULAR | Status: DC | PRN

## 2020-09-28 MED ORDER — LABETALOL HCL 5 MG/ML IV SOLN
20.0000 mg | INTRAVENOUS | Status: DC | PRN
  Administered 2020-09-28 – 2020-09-29 (×3): 20 mg via INTRAVENOUS
  Filled 2020-09-28 (×3): qty 4

## 2020-09-28 MED ORDER — MORPHINE SULFATE (PF) 0.5 MG/ML IJ SOLN
INTRAMUSCULAR | Status: AC
  Filled 2020-09-28: qty 10

## 2020-09-28 MED ORDER — BUTORPHANOL TARTRATE 1 MG/ML IJ SOLN: 1.0000 mg | INTRAMUSCULAR | Status: DC | PRN

## 2020-09-28 MED ORDER — LACTATED RINGERS IV SOLN: INTRAVENOUS | Status: DC

## 2020-09-28 MED ORDER — FENTANYL CITRATE (PF) 100 MCG/2ML IJ SOLN
INTRAMUSCULAR | Status: AC
  Filled 2020-09-28: qty 2

## 2020-09-28 MED ORDER — MISOPROSTOL 25 MCG QUARTER TABLET
25.0000 ug | ORAL_TABLET | ORAL | Status: DC
  Administered 2020-09-28 (×4): 25 ug via BUCCAL
  Filled 2020-09-28 (×4): qty 1

## 2020-09-28 MED ORDER — LABETALOL HCL 5 MG/ML IV SOLN
40.0000 mg | INTRAVENOUS | Status: DC | PRN
  Administered 2020-09-28 – 2020-09-30 (×3): 40 mg via INTRAVENOUS
  Filled 2020-09-28 (×3): qty 8

## 2020-09-28 SURGICAL SUPPLY — 28 items
CATH KIT ON-Q SILVERSOAK 5IN (CATHETERS) ×4 IMPLANT
CHLORAPREP W/TINT 26 (MISCELLANEOUS) ×4 IMPLANT
COVER WAND RF STERILE (DRAPES) ×2 IMPLANT
DERMABOND ADHESIVE PROPEN (GAUZE/BANDAGES/DRESSINGS) ×1
DERMABOND ADVANCED (GAUZE/BANDAGES/DRESSINGS) ×1
DERMABOND ADVANCED .7 DNX12 (GAUZE/BANDAGES/DRESSINGS) ×1 IMPLANT
DERMABOND ADVANCED .7 DNX6 (GAUZE/BANDAGES/DRESSINGS) ×1 IMPLANT
ELECT CAUTERY BLADE 6.4 (BLADE) ×2 IMPLANT
ELECT REM PT RETURN 9FT ADLT (ELECTROSURGICAL) ×4
ELECTRODE REM PT RTRN 9FT ADLT (ELECTROSURGICAL) ×2 IMPLANT
GLOVE SURG NEOPR MICRO LF SZ8 (GLOVE) ×2 IMPLANT
GOWN STRL REUS W/ TWL LRG LVL3 (GOWN DISPOSABLE) ×1 IMPLANT
GOWN STRL REUS W/ TWL XL LVL3 (GOWN DISPOSABLE) ×2 IMPLANT
GOWN STRL REUS W/TWL LRG LVL3 (GOWN DISPOSABLE) ×1
GOWN STRL REUS W/TWL XL LVL3 (GOWN DISPOSABLE) ×2
MANIFOLD NEPTUNE II (INSTRUMENTS) ×2 IMPLANT
MAT PREVALON FULL STRYKER (MISCELLANEOUS) ×2 IMPLANT
NS IRRIG 1000ML POUR BTL (IV SOLUTION) ×2 IMPLANT
PACK C SECTION AR (MISCELLANEOUS) IMPLANT
PAD OB MATERNITY 4.3X12.25 (PERSONAL CARE ITEMS) ×2 IMPLANT
PAD PREP 24X41 OB/GYN DISP (PERSONAL CARE ITEMS) ×2 IMPLANT
PENCIL SMOKE EVACUATOR (MISCELLANEOUS) ×2 IMPLANT
PUMP ON Q 270MLX5ML/HR (PAIN MANAGEMENT) ×2 IMPLANT
SUT MAXON ABS #0 GS21 30IN (SUTURE) ×4 IMPLANT
SUT VIC AB 0 CT1 36 (SUTURE) ×4 IMPLANT
SUT VIC AB 1 CT1 36 (SUTURE) ×6 IMPLANT
SUT VIC AB 2-0 CT1 36 (SUTURE) ×2 IMPLANT
SUT VIC AB 4-0 FS2 27 (SUTURE) ×2 IMPLANT

## 2020-09-28 NOTE — Anesthesia Preprocedure Evaluation (Signed)
Anesthesia Evaluation  Patient identified by MRN, date of birth, ID band Patient awake    Reviewed: Allergy & Precautions, NPO status , Patient's Chart, lab work & pertinent test results  Airway Mallampati: III  TM Distance: >3 FB Neck ROM: Full    Dental no notable dental hx.    Pulmonary neg pulmonary ROS, former smoker,    Pulmonary exam normal breath sounds clear to auscultation       Cardiovascular hypertension, negative cardio ROS Normal cardiovascular exam Rhythm:Regular Rate:Normal     Neuro/Psych negative neurological ROS  negative psych ROS   GI/Hepatic Neg liver ROS, GERD  ,  Endo/Other  negative endocrine ROS  Renal/GU negative Renal ROS  negative genitourinary   Musculoskeletal negative musculoskeletal ROS (+)   Abdominal   Peds negative pediatric ROS (+)  Hematology negative hematology ROS (+)   Anesthesia Other Findings   Reproductive/Obstetrics (+) Pregnancy Preeclampsia at term                             Anesthesia Physical Anesthesia Plan  ASA: 2 and emergent  Anesthesia Plan: Spinal   Post-op Pain Management:    Induction:   PONV Risk Score and Plan:   Airway Management Planned: Natural Airway and Nasal Cannula  Additional Equipment:   Intra-op Plan:   Post-operative Plan:   Informed Consent: I have reviewed the patients History and Physical, chart, labs and discussed the procedure including the risks, benefits and alternatives for the proposed anesthesia with the patient or authorized representative who has indicated his/her understanding and acceptance.     Dental Advisory Given  Plan Discussed with: Anesthesiologist, CRNA and Surgeon  Anesthesia Plan Comments: (Patient reports no bleeding problems and no anticoagulant use.  Plan for spinal with backup GA  Patient consented for risks of anesthesia including but not limited to:  - adverse  reactions to medications - damage to eyes, teeth, lips or other oral mucosa - nerve damage due to positioning  - risk of bleeding, infection and or nerve damage from spinal that could lead to paralysis - risk of headache or failed spinal - damage to teeth, lips or other oral mucosa - sore throat or hoarseness - damage to heart, brain, nerves, lungs, other parts of body or loss of life  Patient voiced understanding.)        Anesthesia Quick Evaluation

## 2020-09-28 NOTE — Progress Notes (Addendum)
RN notified Dr. Tiburcio Pea at [phone] of two severe range BPs at 1530 (168/101) and 1545 (164/99), treated with 20mg  labetalol IV. Repeat BP 118/99. Original POC was to begin Magnesium Sulfate therapy once the pt. was 2cm dilated, however given the pt.'s severe range BPs, Dr. decided to begin Mag Sulfate therapy now. 4g Mag Sulfate bolus started 1631, now running at 2g/hr. LR changed from 159mL/hr to 83mL/hr to total 141mL/hr.  Pt. is alert & oriented x4, lungs clear bilaterally, no clonus noted, and reflexes +2. Pre-existing bilateral +2 lower extremity pitting edema. Denies headache, dizziness, blurry vision, floaters, or any visual changes, as well as RUQ pain.  Addendum: two more severe range BPs at 1648 (169/106) and 1654 (168/98). 40mg  labetalol IV given. Repeat BP 173/94. 80mg  labetalol IV given. Repeat BP 149/94. No additional medications given at this time.

## 2020-09-28 NOTE — Progress Notes (Signed)
  Labor Progress Note   35 y.o. [id number] @ [redacted]w[redacted]d , admitted for  Pregnancy, Labor Management. Preeclampsia  Subjective:  No pain Pt has been treated for severe range BP earlier, stable now  Objective:  BP (!) 154/91 (BP Location: Left Arm)   Pulse 98   Temp 99.3 F (37.4 C) (Oral)   Resp 20   Ht 5\' 7"  (1.702 m)   Wt (!) 141.9 kg   LMP 01/24/2020 (Exact Date)   BMI 49.00 kg/m  Abd: gravid, ND, FHT present, mild, without guarding, without rebound tenderness on exam Extr: 2+ bilateral pedal edema SVE: CERVIX: 0-1 cm dilated, 50 effaced, -3 station  EFM: FHR: 140 bpm, variability: moderate,  accelerations:  Present,  decelerations:  Absent Toco: no ctxs Labs: I have reviewed the patient's lab results.   Assessment & Plan:  03/25/2020 @ [redacted]w[redacted]d, admitted for  Pregnancy and Labor/Delivery Management  1. Pain management: none. 2. FWB: FHT category 1.  3. ID: GBS negative (tested today) 4. Labor management: Magnesium due to Preeclampsia (severe) Cytotec dose #3 given now BP management  All discussed with patient, see orders  [redacted]w[redacted]d, MD, Annamarie Major Ob/Gyn, [facility] 09/28/2020  6:45 PM

## 2020-09-28 NOTE — Progress Notes (Signed)
  Labor and Delivery Progress Note   35 y.o. [id number] @ [redacted]w[redacted]d , admitted for Preeclampsia  Subjective:  Pt has been showing progression of either gestational hypertension or development of superimposed preeclampsia.  She has occas headache, treated w Tylenol.  She has edema.  Labs from yesterday show worsening proteinuria.  [place] (see below) shows oligohydramnios.  She has had some severe range blood pressure despite medicine therapy in escalating doses.  Prior NSVD x2, although many years ago.  Pregnancy complicated by obesity.  Objective:  BP (!) 143/97 (BP Location: Left Arm)   Pulse 100   Temp 98.4 F (36.9 C) (Oral)   Resp 16   Ht 5\' 7"  (1.702 m)   Wt (!) 141.9 kg   LMP 01/24/2020 (Exact Date)   BMI 49.00 kg/m  Abd: gravid, ND, FHT present, without guarding, with involuntary guarding tenderness on exam Extr: 2+ bilateral pedal edema SVE: EXTERNAL GENITALIA: normal appearing vulva with no masses, tenderness or lesions CERVIX: 0 cm dilated, ext os dimple, 50 effaced, -3 station, presenting part Vtx VAGINA: no abnormalities noted MEMBRANES: intact  EFM: FHR: 140 bpm, variability: moderate,  accelerations:  Present,  decelerations:  Absent Toco: None Labs: Current lab results:  Recent Labs    09/27/20 1606  WBC 11.4*  HGB 12.5  HCT 37.9   US= AFI 4, one pocket >2 cm   Vtx  Assessment & Plan:  11/27/20 @ [redacted]w[redacted]d, admitted for  Pregnancy and Labor/Delivery Management  1. Pain management: none. 2. FWB: FHT category 1.  3. ID: GBS not done 4. Labor management: Discussed pros and cons of monitoring vs delivery planning.  As she has severe criteria for preeclampsia, is >34 weeks, and is showing poor response to bed rest or medicine therapies, delivery planning is recommended and she agrees.  Plan Cytotec, then Pitocin.  Magnesium planned.  ABX when active (GBS).  BP control.  Fetal monitoring.  Prior BMZ given. Neonatal consult.  Patient has been counseled as to the risks of  prematurity, including risks of respiratory depression or distress, jaundice, feeding or temperature regulation problems, neurologic concerns including hearing or visual problems, and brain complications.  Patient understands the risks of tocolytic therapies along with their inherent failure rates, and the reasons for and against their use in individual circumstances.   Nursing Staff Provider  Office Location  [facility] Dating  LMP = 12 week [redacted]w[redacted]d  Language English Anatomy US  complete, normal  Flu Vaccine  declined Genetic Screen NIPS:  Normal XY  TDaP vaccine  08/08/20 Hgb A1C or GTT Early: 1h: 173 - repeat 1h: 155; Third trimester: 3h at 26w1 - 74/139/138/71  Rhogam  n/a   LAB RESULTS  Feeding Plan  formula Blood Type AB/Positive/-- (12/16 1637)  Contraception  BTL Antibody Negative (12/16 1637)  Circumcision   Rubella 14.50 (12/16 1637)  Pediatrician   RPR Non Reactive (04/07 1238)  Support Person  03[birth year]-1998 HBsAg Negative (12/16 1637)  Prenatal Classes   HIV Non Reactive (04/07 1238)      Varicella  immune  BTL Consent  08/08/20 GBS not done yet           VBAC Consent  n/a Pap  ASCUS HPV negative 04/04/2020     All discussed with patient, see orders  04/06/2020, MD, Annamarie Major Ob/Gyn, [facility] 09/28/2020  8:44 AM

## 2020-09-28 NOTE — Progress Notes (Signed)
RN called Tiburcio Pea MD at 2224. RN stated pt. Cervix remains unchanged, category 2 FHR tracing, indeterminate decelerations due to difficulty tracing pt. Contractions despite RN frequently palpating and adjusting. RN discussed severe range pressures and administration of 20mg  labetalol IV at 2153. MD provided orders to continue with induction via vaginal and buccal cytotec. MD states he has been intermittently reviewing tracing.

## 2020-09-28 NOTE — Progress Notes (Signed)
  Labor Progress Note   35 y.o. [id number] @ [redacted]w[redacted]d , admitted for  Pregnancy, Labor Management. Preeclampsia  Subjective:  No change in cervix after 4 doses of Cytotec.  However, she is having more repetitive decelrations, potential of late quality (no ctxs omn toco to assess with), pt w known oligohydramnios  Objective:  BP 126/76 (BP Location: Left Arm)   Pulse 98   Temp 98.6 F (37 C) (Oral)   Resp 16   Ht 5\' 7"  (1.702 m)   Wt (!) 141.9 kg   LMP 01/24/2020 (Exact Date)   BMI 49.00 kg/m  Abd: gravid, ND, FHT present, mild tenderness on exam Extr: 2+ bilateral pedal edema SVE: CERVIX: unchanged  EFM: FHR: 140 bpm, variability: moderate,  accelerations:  Present,  decelerations:  Present every 5 min lasting 30-60sec, recovery, nadir 90 bpm Toco: None Labs: I have reviewed the patient's lab results.   Assessment & Plan:  03/25/2020 @ [redacted]w[redacted]d, admitted for  Pregnancy and Labor/Delivery Management  1. Pain management: none. 2. FWB: FHT category 2.  3. ID: GBS negative 4. Labor management: Options discussed.  As remote from delivery, and concerns w FHR montoring results and effects on fetus, and inability to place IUPC or amnioinfusion, we discussed CS option.  The risks of cesarean section discussed with the patient included but were not limited to: bleeding which may require transfusion or reoperation; infection which may require antibiotics; injury to bowel, bladder, ureters or other surrounding organs; injury to the fetus; need for additional procedures including hysterectomy in the event of a life-threatening hemorrhage; placental abnormalities wth subsequent pregnancies, incisional problems, thromboembolic phenomenon and other postoperative/anesthesia complications. The patient concurred with the proposed plan, giving informed written consent for the procedure.   Cont Magnesium 24 hours post op Cont Labetalol for severe range BPs  All discussed with patient, see orders  [redacted]w[redacted]d, MD, Annamarie Major Ob/Gyn, [facility] 09/28/2020  11:41 PM

## 2020-09-29 ENCOUNTER — Other Ambulatory Visit: Payer: Self-pay

## 2020-09-29 ENCOUNTER — Encounter: Payer: Self-pay | Admitting: Obstetrics & Gynecology

## 2020-09-29 ENCOUNTER — Inpatient Hospital Stay: Payer: BC Managed Care – PPO | Admitting: Anesthesiology

## 2020-09-29 DIAGNOSIS — O1414 Severe pre-eclampsia complicating childbirth: Principal | ICD-10-CM

## 2020-09-29 DIAGNOSIS — Z302 Encounter for sterilization: Secondary | ICD-10-CM

## 2020-09-29 DIAGNOSIS — Z3A35 35 weeks gestation of pregnancy: Secondary | ICD-10-CM

## 2020-09-29 DIAGNOSIS — O4103X0 Oligohydramnios, third trimester, not applicable or unspecified: Secondary | ICD-10-CM

## 2020-09-29 LAB — CBC
HCT: 37.7 % (ref 36.0–46.0)
Hemoglobin: 12.9 g/dL (ref 12.0–15.0)
MCH: 27.9 pg (ref 26.0–34.0)
MCHC: 34.2 g/dL (ref 30.0–36.0)
MCV: 81.4 fL (ref 80.0–100.0)
Platelets: 211 10*3/uL (ref 150–400)
RBC: 4.63 MIL/uL (ref 3.87–5.11)
RDW: 14.3 % (ref 11.5–15.5)
WBC: 14.5 10*3/uL — ABNORMAL HIGH (ref 4.0–10.5)
nRBC: 0 % (ref 0.0–0.2)

## 2020-09-29 LAB — CREATININE, SERUM
Creatinine, Ser: 0.61 mg/dL (ref 0.44–1.00)
GFR, Estimated: >60 mL/min (ref >60)

## 2020-09-29 MED ORDER — NIFEDIPINE ER OSMOTIC RELEASE 30 MG PO TB24
90.0000 mg | ORAL_TABLET | Freq: Every day | ORAL | Status: DC
  Administered 2020-09-29 – 2020-10-02 (×4): 90 mg via ORAL
  Filled 2020-09-29 (×4): qty 3

## 2020-09-29 MED ORDER — ENOXAPARIN SODIUM 80 MG/0.8ML IJ SOSY
0.5000 mg/kg | PREFILLED_SYRINGE | INTRAMUSCULAR | Status: DC
  Administered 2020-09-30 – 2020-10-02 (×3): 70 mg via SUBCUTANEOUS
  Filled 2020-09-29 (×4): qty 0.7

## 2020-09-29 MED ORDER — BUPIVACAINE IN DEXTROSE 0.75-8.25 % IT SOLN
INTRATHECAL | Status: DC | PRN
  Administered 2020-09-29: 1.6 mL via INTRATHECAL

## 2020-09-29 MED ORDER — ONDANSETRON HCL 4 MG/2ML IJ SOLN: 4.0000 mg | Freq: Once | INTRAMUSCULAR | Status: DC | PRN

## 2020-09-29 MED ORDER — BUPIVACAINE HCL (PF) 0.5 % IJ SOLN
INTRAMUSCULAR | Status: AC
  Filled 2020-09-29: qty 30

## 2020-09-29 MED ORDER — NALOXONE HCL 0.4 MG/ML IJ SOLN: 0.4000 mg | INTRAMUSCULAR | Status: DC | PRN

## 2020-09-29 MED ORDER — OXYTOCIN-SODIUM CHLORIDE 30-0.9 UT/500ML-% IV SOLN
2.5000 [IU]/h | INTRAVENOUS | Status: AC
  Administered 2020-09-29 (×2): 2.5 [IU]/h via INTRAVENOUS
  Filled 2020-09-29: qty 500

## 2020-09-29 MED ORDER — MORPHINE SULFATE (PF) 0.5 MG/ML IJ SOLN
INTRAMUSCULAR | Status: DC | PRN
  Administered 2020-09-29: 100 ug via EPIDURAL

## 2020-09-29 MED ORDER — DIPHENHYDRAMINE HCL 50 MG/ML IJ SOLN: 12.5000 mg | INTRAMUSCULAR | Status: DC | PRN

## 2020-09-29 MED ORDER — SODIUM CHLORIDE 0.9 % IV SOLN
INTRAVENOUS | Status: AC
  Filled 2020-09-29: qty 2

## 2020-09-29 MED ORDER — PRENATAL MULTIVITAMIN CH
1.0000 | ORAL_TABLET | Freq: Every day | ORAL | Status: DC
  Administered 2020-09-29 – 2020-10-02 (×4): 1 via ORAL
  Filled 2020-09-29 (×3): qty 1

## 2020-09-29 MED ORDER — SODIUM CHLORIDE 0.9% FLUSH: 3.0000 mL | INTRAVENOUS | Status: DC | PRN

## 2020-09-29 MED ORDER — MENTHOL 3 MG MT LOZG
1.0000 | LOZENGE | OROMUCOSAL | Status: DC | PRN
  Filled 2020-09-29: qty 9

## 2020-09-29 MED ORDER — DIPHENHYDRAMINE HCL 25 MG PO CAPS: 25.0000 mg | ORAL_CAPSULE | Freq: Four times a day (QID) | ORAL | Status: DC | PRN

## 2020-09-29 MED ORDER — FENTANYL CITRATE (PF) 100 MCG/2ML IJ SOLN
INTRAMUSCULAR | Status: DC | PRN
  Administered 2020-09-29: 15 ug via INTRAVENOUS

## 2020-09-29 MED ORDER — DEXAMETHASONE SODIUM PHOSPHATE 10 MG/ML IJ SOLN
INTRAMUSCULAR | Status: DC | PRN
  Administered 2020-09-29: 10 mg via INTRAVENOUS

## 2020-09-29 MED ORDER — SCOPOLAMINE 1 MG/3DAYS TD PT72: 1.0000 | MEDICATED_PATCH | Freq: Once | TRANSDERMAL | Status: DC

## 2020-09-29 MED ORDER — COCONUT OIL OIL
1.0000 "application " | TOPICAL_OIL | Status: DC | PRN
  Filled 2020-09-29: qty 120

## 2020-09-29 MED ORDER — SIMETHICONE 80 MG PO CHEW
80.0000 mg | CHEWABLE_TABLET | ORAL | Status: DC | PRN
  Filled 2020-09-29: qty 1

## 2020-09-29 MED ORDER — ACETAMINOPHEN 325 MG PO TABS
650.0000 mg | ORAL_TABLET | ORAL | Status: DC | PRN
Start: 2020-09-29 — End: 2020-10-02
  Administered 2020-09-30: 650 mg via ORAL
  Filled 2020-09-29 (×2): qty 2

## 2020-09-29 MED ORDER — PHENYLEPHRINE HCL (PRESSORS) 10 MG/ML IV SOLN: INTRAVENOUS | Status: DC | PRN

## 2020-09-29 MED ORDER — DIBUCAINE (PERIANAL) 1 % EX OINT: 1.0000 "application " | TOPICAL_OINTMENT | CUTANEOUS | Status: DC | PRN

## 2020-09-29 MED ORDER — BUPIVACAINE ON-Q PAIN PUMP (FOR ORDER SET NO CHG)
INJECTION | Status: AC
  Filled 2020-09-29 (×4): qty 1

## 2020-09-29 MED ORDER — ONDANSETRON HCL 4 MG/2ML IJ SOLN
INTRAMUSCULAR | Status: DC | PRN
  Administered 2020-09-29: 4 mg via INTRAVENOUS

## 2020-09-29 MED ORDER — FENTANYL CITRATE (PF) 100 MCG/2ML IJ SOLN: 25.0000 ug | INTRAMUSCULAR | Status: DC | PRN

## 2020-09-29 MED ORDER — IBUPROFEN 600 MG PO TABS
600.0000 mg | ORAL_TABLET | Freq: Four times a day (QID) | ORAL | Status: DC
  Administered 2020-09-30 – 2020-10-02 (×9): 600 mg via ORAL
  Filled 2020-09-29 (×10): qty 1

## 2020-09-29 MED ORDER — MORPHINE SULFATE (PF) 2 MG/ML IV SOLN: 1.0000 mg | INTRAVENOUS | Status: DC | PRN

## 2020-09-29 MED ORDER — LIDOCAINE HCL (PF) 2 % IJ SOLN
INTRAMUSCULAR | Status: AC
  Filled 2020-09-29: qty 5

## 2020-09-29 MED ORDER — SENNOSIDES-DOCUSATE SODIUM 8.6-50 MG PO TABS
2.0000 | ORAL_TABLET | ORAL | Status: DC
  Administered 2020-09-29 – 2020-10-02 (×3): 2 via ORAL
  Filled 2020-09-29 (×4): qty 2

## 2020-09-29 MED ORDER — LACTATED RINGERS IV SOLN: INTRAVENOUS | Status: DC

## 2020-09-29 MED ORDER — KETOROLAC TROMETHAMINE 30 MG/ML IJ SOLN
30.0000 mg | Freq: Four times a day (QID) | INTRAMUSCULAR | Status: AC
  Administered 2020-09-29 – 2020-09-30 (×2): 30 mg via INTRAVENOUS
  Filled 2020-09-29 (×2): qty 1

## 2020-09-29 MED ORDER — SIMETHICONE 80 MG PO CHEW
80.0000 mg | CHEWABLE_TABLET | Freq: Three times a day (TID) | ORAL | Status: DC
  Administered 2020-09-29 – 2020-10-02 (×9): 80 mg via ORAL
  Filled 2020-09-29 (×9): qty 1

## 2020-09-29 MED ORDER — WITCH HAZEL-GLYCERIN EX PADS: 1.0000 "application " | MEDICATED_PAD | CUTANEOUS | Status: DC | PRN

## 2020-09-29 MED ORDER — ZOLPIDEM TARTRATE 5 MG PO TABS: 5.0000 mg | ORAL_TABLET | Freq: Every evening | ORAL | Status: DC | PRN

## 2020-09-29 MED ORDER — DEXAMETHASONE SODIUM PHOSPHATE 10 MG/ML IJ SOLN: INTRAMUSCULAR | Status: DC | PRN

## 2020-09-29 MED ORDER — LACTATED RINGERS IV SOLN: INTRAVENOUS | Status: DC | PRN

## 2020-09-29 MED ORDER — OXYCODONE-ACETAMINOPHEN 5-325 MG PO TABS
1.0000 | ORAL_TABLET | ORAL | Status: DC | PRN
  Administered 2020-09-30 – 2020-10-02 (×7): 2 via ORAL
  Filled 2020-09-29 (×8): qty 2

## 2020-09-29 MED ORDER — PHENYLEPHRINE HCL-NACL 10-0.9 MG/250ML-% IV SOLN
INTRAVENOUS | Status: DC | PRN
  Administered 2020-09-29: 30 ug/min via INTRAVENOUS

## 2020-09-29 MED ORDER — DIPHENHYDRAMINE HCL 25 MG PO CAPS
25.0000 mg | ORAL_CAPSULE | ORAL | Status: DC | PRN
  Administered 2020-09-29: 25 mg via ORAL
  Filled 2020-09-29: qty 1

## 2020-09-29 MED ORDER — BUPIVACAINE HCL (PF) 0.5 % IJ SOLN
INTRAMUSCULAR | Status: DC | PRN
  Administered 2020-09-29: 10 mL

## 2020-09-29 MED ORDER — PHENYLEPHRINE 40 MCG/ML (10ML) SYRINGE FOR IV PUSH (FOR BLOOD PRESSURE SUPPORT)
PREFILLED_SYRINGE | INTRAVENOUS | Status: DC | PRN
  Administered 2020-09-29 (×4): 100 ug via INTRAVENOUS

## 2020-09-29 MED ORDER — NALOXONE HCL 4 MG/10ML IJ SOLN
1.0000 ug/kg/h | INTRAVENOUS | Status: DC | PRN
  Filled 2020-09-29: qty 5

## 2020-09-29 NOTE — Anesthesia Procedure Notes (Signed)
Spinal  Patient location during procedure: OR Start time: 09/29/2020 12:30 AM End time: 09/29/2020 12:36 AM Reason for block: surgical anesthesia Staffing Performed: resident/CRNA  Resident/CRNA: Justus Memory, CRNA Preanesthetic Checklist Completed: patient identified, IV checked, risks and benefits discussed, surgical consent, monitors and equipment checked, pre-op evaluation and timeout performed Spinal Block Patient position: sitting Prep: Betadine Patient monitoring: heart rate, continuous pulse ox, blood pressure and cardiac monitor Approach: midline Location: L3-4 Injection technique: single-shot Needle Needle type: Introducer and Pencan  Needle gauge: 24 G Needle length: 9 cm Assessment Events: CSF return Additional Notes Negative paresthesia. Negative blood return. Positive free-flowing CSF. Expiration date of kit checked and confirmed. Patient tolerated procedure well, without complications.

## 2020-09-29 NOTE — Transfer of Care (Signed)
Immediate Anesthesia Transfer of Care Note  Patient: Brittney Lee  Procedure(s) Performed: CESAREAN SECTION  Patient Location: PACU  Anesthesia Type:Spinal  Level of Consciousness: awake, alert  and oriented  Airway & Oxygen Therapy: Patient Spontanous Breathing  Post-op Assessment: Report given to RN and Post -op Vital signs reviewed and stable  Post vital signs: Reviewed and stable  Last Vitals:  Vitals Value Taken Time  BP    Temp    Pulse    Resp    SpO2      Last Pain:  Vitals:   09/29/20 0001  TempSrc: Oral  PainSc:       Patients Stated Pain Goal: 0 (09/28/20 1922)  Complications: No notable events documented.

## 2020-09-29 NOTE — Discharge Summary (Signed)
OB Discharge Summary     Patient Name: Brittney Lee DOB: [birth year] MRN: [id number]  Date of admission: 09/27/2020 Delivering MD: Letitia Libra, MD  Date of Delivery: 09/27/2020  Date of discharge: 10/02/2020  Admitting diagnosis: Pre-eclampsia affecting pregnancy, antepartum [O14.90] Preeclampsia [O14.90] Intrauterine pregnancy: [redacted]w[redacted]d     Secondary diagnosis: Preeclampsia and obesity     Discharge diagnosis: Preterm Pregnancy Delivered, Preeclampsia (severe), and obesity , Failed induction of labor, Preeclampsia (severe), and Reasons for cesarean section  Non-reassuring FHR                         Hospital course:  Induction of Labor With Cesarean Section   35 y.o. yo [id number] at [redacted]w[redacted]d was admitted to the hospital 09/27/2020 for induction of labor. Patient had a labor course significant for not getting past 1 cm with recurrent decels suggesting fetal concerns; pt had oligohydramnios as well witrh her preeclampsia. The patient went for cesarean section due to Non-Reassuring FHR. Delivery details are as follows: Membrane Rupture Time/Date: 12:58 AM ,09/29/2020   Delivery Method:C-Section, Low Transverse  Details of operation can be found in separate operative Note.  Patient had an uncomplicated postpartum course. She is ambulating, tolerating a regular diet, passing flatus, and urinating well.  Patient is discharged home in stable condition on 10/02/20.      Newborn Data: Birth date:09/29/2020  Birth time:12:58 AM  Gender:Female  Living status:Living  Apgars:4 ,8  Weight:2140 g                                                                                               Post partum procedures:none  Complications: None  Physical exam on 10/02/2020: Vitals:   10/02/20 0315 10/02/20 0750 10/02/20 0935 10/02/20 1138  BP: 135/82 140/86 135/86 135/82  Pulse: 86 94 94 89  Resp: 20 18  20   Temp: 98.4 F (36.9 C) 99.1 F (37.3 C)  98.1 F (36.7 C)  TempSrc: Oral Oral  Oral  SpO2:  98% 94% 99%   Weight:      Height:       General: alert, cooperative, and no distress Lochia: appropriate Uterine Fundus: firm Incision: Healing well with no significant drainage DVT Evaluation: No evidence of DVT seen on physical exam.  Labs: Lab Results  Component Value Date   WBC 18.2 (H) 09/30/2020   HGB 10.5 (L) 09/30/2020   HCT 30.3 (L) 09/30/2020   MCV 82.8 09/30/2020   PLT 192 09/30/2020   CMP Latest Ref Rng & Units 09/29/2020  Glucose 70 - 99 mg/dL -  BUN 6 - 20 mg/dL -  Creatinine 11/29/2020 - 4.74 mg/dL 2.59  Sodium 5.63 - 875 mmol/L -  Potassium 3.5 - 5.1 mmol/L -  Chloride 98 - 111 mmol/L -  CO2 22 - 32 mmol/L -  Calcium 8.9 - 10.3 mg/dL -  Total Protein 6.5 - 8.1 g/dL -  Total Bilirubin 0.3 - 1.2 mg/dL -  Alkaline Phos 38 - 643 U/L -  AST 15 - 41 U/L -  ALT 0 - 44 U/L -  Discharge instruction: per After Visit Summary.  Medications:  Allergies as of 10/02/2020   No Known Allergies      Medication List     TAKE these medications    Cetirizine HCl 10 MG Caps Take by mouth.   enoxaparin 80 MG/0.8ML injection Commonly known as: LOVENOX Inject 0.7 mLs (70 mg total) into the skin daily. Start taking on: October 03, 2020   esomeprazole 20 MG capsule Commonly known as: NexIUM Take 1 capsule (20 mg total) by mouth daily.   labetalol 200 MG tablet Commonly known as: NORMODYNE Take 2 tablets (400 mg total) by mouth 2 (two) times daily. What changed: how much to take   NIFEdipine 90 MG 24 hr tablet Commonly known as: PROCARDIA XL/NIFEDICAL-XL Take 1 tablet (90 mg total) by mouth daily. Start taking on: October 03, 2020 What changed: additional instructions   oxyCODONE-acetaminophen 5-325 MG tablet Commonly known as: PERCOCET/ROXICET Take 1 tablet by mouth every 4 (four) hours as needed for moderate pain.   PrePLUS 27-1 MG Tabs               Discharge Care Instructions  (From admission, onward)           Start     Ordered   10/02/20 0000   No dressing needed        10/02/20 1255            Diet: routine diet  Activity: Advance as tolerated. Pelvic rest for 6 weeks.   Outpatient follow up:  Follow-up Information     Nadara Mustard, MD. Schedule an appointment as soon as possible for a visit in 1 week(s).   Specialty: Obstetrics and Gynecology Why: For Post Op Contact information: [address] [place] [address] [phone]                   Postpartum contraception:  BTL done Rhogam Given postpartum: no Rubella vaccine given postpartum: no Varicella vaccine given postpartum: no TDaP given antepartum or postpartum: Yes  Newborn Data: Live born female  Birth Weight:   APGAR: ,   Newborn Delivery   Birth date/time: 09/29/2020 00:58:00 Delivery type: C-Section, Low Transverse Trial of labor: Yes C-section categorization: Primary       Baby Feeding: Bottle  Disposition: SCN due ot prematurity  SIGNED: Letitia Libra, MD 10/02/2020 12:56 PM

## 2020-09-29 NOTE — Anesthesia Postprocedure Evaluation (Signed)
Anesthesia Post Note  Patient: Brittney Lee  Procedure(s) Performed: CESAREAN SECTION  Patient location during evaluation: L&D Anesthesia Type: Spinal Level of consciousness: awake and oriented Pain management: pain level controlled Vital Signs Assessment: post-procedure vital signs reviewed and stable Respiratory status: spontaneous breathing and respiratory function stable Cardiovascular status: blood pressure returned to baseline and stable Postop Assessment: no headache, no backache, no apparent nausea or vomiting and able to ambulate Anesthetic complications: no   No notable events documented.   Last Vitals:  Vitals:   09/29/20 0700 09/29/20 0705  BP: 140/83   Pulse: 85   Resp: 16   Temp:  36.6 C  SpO2:      Last Pain:  Vitals:   09/29/20 0701  TempSrc:   PainSc: 0-No pain                 Felicita Gage

## 2020-09-29 NOTE — Op Note (Signed)
Cesarean Section Procedure Note Indications: non-reassuring fetal status, oligohydramnios, and severe preeclampsia, Desire for permanent sterility Pre-operative Diagnosis: Intrauterine pregnancy [redacted]w[redacted]d ;  non-reassuring fetal status, oligohydramnios, and severe preeclampsia, Desire for permanent sterility Post-operative Diagnosis: same, delivered. Procedure: Low Transverse Cesarean Section, Bilateral Tubal Ligation Surgeon: Annamarie Major, MD Assistant(s): Heloise Ochoa, CNM, No other capable assistant available, in surgery requiring high level assistant. Anesthesia: Spinal anesthesia Estimated Blood Loss:800 mL Complications: None; patient tolerated the procedure well. Disposition: PACU - hemodynamically stable. Condition: stable  Findings: A female infant in the cephalic presentation. Amniotic fluid - Clear  Birth weight: pending Apgars pending; infant to SCN.  Intact placenta with a three-vessel cord. Grossly normal uterus, tubes and ovaries bilaterally. No intraabdominal adhesions were noted.  Procedure Details   The patient was taken to Operating Room, identified as the correct patient and the procedure verified as C-Section Delivery. A Time Out was held and the above information confirmed. After induction of anesthesia, the patient was draped and prepped in the usual sterile manner. A Pfannenstiel incision was made and carried down through the subcutaneous tissue to the fascia. Fascial incision was made and extended transversely with the Mayo scissors. The fascia was separated from the underlying rectus tissue superiorly and inferiorly. The peritoneum was identified and entered bluntly. Peritoneal incision was extended longitudinally. The utero-vesical peritoneal reflection was incised transversely and a bladder flap was created digitally.  A low transverse hysterotomy was made. The fetus was delivered atraumatically. The umbilical cord was clamped x2 and cut and the infant was handed to the  awaiting pediatricians. The placenta was removed intact and appeared normal with a 3-vessel cord.  The uterus was exteriorized and cleared of all clot and debris. The hysterotomy was closed with running sutures of 0 Vicryl suture. A second imbricating layer was placed with the same suture. Excellent hemostasis was observed.   The left Fallopian tube was identified, grasped with the Babcock clamps, lifted to the skin incision and followed out distally to the fimbriae. An avascular midsection of the tube approximately 3-4cm from the cornua was grasped with the babcock clamps and brought into a knuckle at the skin incision. The tube was double ligated with 2-0 Vicryl suture and the intervening portion of tube was transected and removed. Excellent hemostasis was noted and the tube was returned to the abdomen. Attention was then turned to the right fallopian tube after confirmation of identification by tracing the tube out to the fimbriae. The same procedure was then performed on the right Fallopian tube. Again, excellent hemostasis was noted at the end of the procedure.  The uterus was returned to the abdomen. The pelvis was irrigated and again, excellent hemostasis was noted.  The On Q Pain pump System was then placed.  Trocars were placed through the abdominal wall into the subfascial space and these were used to thread the silver soaker cathaters into place.The rectus fascia was then reapproximated with running sutures of Maxon, with careful placement not to incorporate the cathaters. Subcutaneous tissues are then irrigated with saline and hemostasis assured.  Skin is then closed with 4-0 vicryl suture in a subcuticular fashion followed by skin adhesive. The cathaters are flushed each with 5 mL of Bupivicaine and stabilized into place with dressing.  The surgical assistant performed tissue retraction, assistance with suturing, and fundal pressure.   Instrument, sponge, and needle counts were correct prior to  the abdominal closure and at the conclusion of the case.  The patient tolerated the procedure well and  was transferred to the recovery room in stable condition.   Annamarie Major, MD, [facility], [facility] 09/29/2020  1:43 AM

## 2020-09-29 NOTE — Progress Notes (Signed)
Anticoagulation monitoring(Lovenox):  35 yo female ordered Lovenox 40 mg Q24h    Filed Weights   09/27/20 1911  Weight: (!) 141.9 kg (312 lb 13.3 oz)   BMI 49    Lab Results  Component Value Date   CREATININE 0.70 09/27/2020   CREATININE 0.51 09/20/2020   CREATININE 0.63 09/09/2020   Estimated Creatinine Clearance: 149.3 mL/min (by C-G formula based on SCr of 0.7 mg/dL). Hemoglobin & Hematocrit     Component Value Date/Time   HGB 12.5 09/27/2020 1606   HGB 11.9 07/25/2020 1238   HCT 37.9 09/27/2020 1606   HCT 37.1 07/25/2020 1238     Per Protocol for Patient with estCrcl > 30 ml/min and BMI > 40, will transition to Lovenox 70 mg Q24h.

## 2020-09-29 NOTE — Progress Notes (Signed)
1 Day Post-Op Procedure(s): CESAREAN SECTION and BTL  Subjective: Patient reports tolerating PO and no pain or bleeding.    Objective: I have reviewed patient's vital signs, intake and output, medications, and labs.  Abd: Min T, ND Incision: clean, dry, and intact Extr: no calf T, no edema  Assessment: s/p Procedure(s): CESAREAN SECTION: stable S/p BTL for contraception  Plan: Advance diet Encourage ambulation Advance to PO medication Cont Magnesium for 24 hours postpartum Start Lovenox for DVT prevention based on risk factors, after 24 hour has passed from CS Procardia for BP    LOS: 1 day    Brittney Lee 09/29/2020, 11:00 AM

## 2020-09-30 ENCOUNTER — Encounter: Payer: Self-pay | Admitting: Obstetrics & Gynecology

## 2020-09-30 LAB — CBC
HCT: 30.3 % — ABNORMAL LOW (ref 36.0–46.0)
Hemoglobin: 10.5 g/dL — ABNORMAL LOW (ref 12.0–15.0)
MCH: 28.7 pg (ref 26.0–34.0)
MCHC: 34.7 g/dL (ref 30.0–36.0)
MCV: 82.8 fL (ref 80.0–100.0)
Platelets: 192 10*3/uL (ref 150–400)
RBC: 3.66 MIL/uL — ABNORMAL LOW (ref 3.87–5.11)
RDW: 14.6 % (ref 11.5–15.5)
WBC: 18.2 10*3/uL — ABNORMAL HIGH (ref 4.0–10.5)
nRBC: 0 % (ref 0.0–0.2)

## 2020-09-30 MED ORDER — LABETALOL HCL 200 MG PO TABS
400.0000 mg | ORAL_TABLET | Freq: Two times a day (BID) | ORAL | Status: DC
  Administered 2020-09-30 – 2020-10-02 (×5): 400 mg via ORAL
  Filled 2020-09-30 (×5): qty 2

## 2020-09-30 NOTE — Progress Notes (Signed)
Brief Pharmacy Note  Pharmacy has been consulted for our anti-hypertensive meds to beds program.   Patient has declined the meds to beds program and states she will pick up prescription from her regular pharmacy ([facility])   Angelique Blonder, Clinical Pharmacist 09/30/2020 10:46 AM

## 2020-09-30 NOTE — Progress Notes (Signed)
Obstetric Postpartum/PostOperative Daily Progress Note Subjective:  35 y.o. N5A2130 post-operative day # 1 status post primary cesarean delivery.  She is ambulating, is tolerating po, is voiding spontaneously.  Her pain is well controlled on PO pain medications. Her lochia is less than menses.   Medications SCHEDULED MEDICATIONS   enoxaparin (LOVENOX) injection  0.5 mg/kg Subcutaneous Q24H   ibuprofen  600 mg Oral Q6H   NIFEdipine  90 mg Oral Daily   prenatal multivitamin  1 tablet Oral Q1200   scopolamine  1 patch Transdermal Once   senna-docusate  2 tablet Oral Q24H   simethicone  80 mg Oral TID PC    MEDICATION INFUSIONS   bupivacaine 0.25 % ON-Q pump DUAL CATH 400 mL     bupivacaine ON-Q pain pump     lactated ringers Stopped (09/30/20 0205)   magnesium sulfate Stopped (09/30/20 0103)   naLOXone (NARCAN) adult infusion for PRURITIS      PRN MEDICATIONS  acetaminophen, coconut oil, witch hazel-glycerin **AND** dibucaine, diphenhydrAMINE **OR** diphenhydrAMINE, labetalol **AND** labetalol **AND** labetalol **AND** hydrALAZINE **AND** [CANCELED] Measure blood pressure, menthol-cetylpyridinium, morphine injection, naloxone **AND** sodium chloride flush, naLOXone (NARCAN) adult infusion for PRURITIS, oxyCODONE-acetaminophen, simethicone, zolpidem    Objective:   Vitals:   09/30/20 0403 09/30/20 0433 09/30/20 0457 09/30/20 0724  BP: 128/72  137/80 (!) 143/93  Pulse: 90  92 97  Resp:      Temp:  98.5 F (36.9 C)  98.4 F (36.9 C)  TempSrc:  Oral  Oral  SpO2:    93%  Weight:      Height:        Current Vital Signs 24h Vital Sign Ranges  T 98.4 F (36.9 C) Temp  Avg: 98.3 F (36.8 C)  Min: 97.8 F (36.6 C)  Max: 98.5 F (36.9 C)  BP (!) 143/93 (RN notified)  BP  Min: 120/68  Max: 167/85  HR 97 Pulse  Avg: 92  Min: 80  Max: 120  RR 14 Resp  Avg: 16.1  Min: 14  Max: 18  SaO2 93 % Room Air SpO2  Avg: 93.9 %  Min: 82 %  Max: 99 %       24 Hour I/O Current Shift I/O   Time Ins Outs 06/12 0701 - 06/13 0700 In: 3644.4 [P.O.:1370; I.V.:2274.4] Out: 4707 [Urine:4707] No intake/output data recorded.  General: NAD Pulmonary: no increased work of breathing Abdomen: non-distended, non-tender, fundus firm at level of umbilicus Inc: Clean/dry/intact Extremities: no edema, no erythema, no tenderness  Labs:  Recent Labs  Lab 09/27/20 1606 09/29/20 0602 09/30/20 0428  WBC 11.4* 14.5* 18.2*  HGB 12.5 12.9 10.5*  HCT 37.9 37.7 30.3*  PLT 281 211 192     Assessment:   35 y.o. Q6V7846 postoperative day # 1 status post primary cesarean section, status post 24 hour magnesium sulfate infusion for severe preeclampsia  Plan:   1) Preeclampsia - now s/p Mg, 90 mg daily of Procardia XL - plan to monitor BP today  2) Acute blood loss anemia - hemodynamically stable and asymptomatic  3) AB POS / Rubella 14.50 (12/16 1637)/ Varicella Immune  4) TDAP status UTD - 08/08/20  5) Formula feeding - infant admitted to [facility] /Contraception = bilateral tubal ligation  6) Disposition - continue current care - plan to reassess for discharge status POD2  Zipporah Plants, CNM 09/30/2020 11:40 AM

## 2020-09-30 NOTE — Progress Notes (Signed)
Pt doing well, although has had a few elevated blood pressures requiring treatment today.  Infant in SCN doing well. Will stop magnesium soon (24 hours) and cont to monitor BPs.  Annamarie Major, MD, [facility], [facility] 09/30/2020  12:40 AM

## 2020-10-01 ENCOUNTER — Encounter: Payer: BC Managed Care – PPO | Admitting: Obstetrics & Gynecology

## 2020-10-01 LAB — SURGICAL PATHOLOGY

## 2020-10-01 NOTE — Progress Notes (Signed)
Obstetric Postpartum/PostOperative Daily Progress Note Subjective:  35 y.o. F6O1308 post-operative day # 3 status post primary cesarean delivery.  She is ambulating, is tolerating po, is voiding spontaneously.  Her pain is well controlled on PO pain medications, if she takes them frequently. Her lochia is like than menses. She had her labetalol dose increased yesterday. Her most recent blood pressure was 156/81.    Medications SCHEDULED MEDICATIONS   enoxaparin (LOVENOX) injection  0.5 mg/kg Subcutaneous Q24H   ibuprofen  600 mg Oral Q6H   labetalol  400 mg Oral BID   NIFEdipine  90 mg Oral Daily   prenatal multivitamin  1 tablet Oral Q1200   scopolamine  1 patch Transdermal Once   senna-docusate  2 tablet Oral Q24H   simethicone  80 mg Oral TID PC    MEDICATION INFUSIONS   bupivacaine 0.25 % ON-Q pump DUAL CATH 400 mL     bupivacaine ON-Q pain pump     lactated ringers Stopped (09/30/20 0205)   magnesium sulfate Stopped (09/30/20 0103)   naLOXone (NARCAN) adult infusion for PRURITIS      PRN MEDICATIONS  acetaminophen, coconut oil, witch hazel-glycerin **AND** dibucaine, diphenhydrAMINE **OR** diphenhydrAMINE, labetalol **AND** labetalol **AND** labetalol **AND** hydrALAZINE **AND** [CANCELED] Measure blood pressure, menthol-cetylpyridinium, morphine injection, naloxone **AND** sodium chloride flush, naLOXone (NARCAN) adult infusion for PRURITIS, oxyCODONE-acetaminophen, simethicone, zolpidem    Objective:   Vitals:   09/30/20 2325 10/01/20 0525 10/01/20 0903 10/01/20 1000  BP: 135/81 (!) 129/92 (!) 145/93 (!) 156/81  Pulse: 100 91 94   Resp:   16   Temp: 98.4 F (36.9 C) 98.2 F (36.8 C) 98.5 F (36.9 C)   TempSrc: Oral Oral    SpO2: 96% 96% 97%   Weight:      Height:        Current Vital Signs 24h Vital Sign Ranges  T 98.5 F (36.9 C) (notify Emily RN of bp) Temp  Avg: 98.5 F (36.9 C)  Min: 98.2 F (36.8 C)  Max: 99 F (37.2 C)  BP (!) 156/81  BP  Min: 129/92   Max: 156/81  HR 94 Pulse  Avg: 95.3  Min: 91  Max: 100  RR 16 Resp  Avg: 16  Min: 16  Max: 16  SaO2 97 %  (room air) SpO2  Avg: 96.2 %  Min: 93 %  Max: 98 %       24 Hour I/O Current Shift I/O  Time Ins Outs 06/13 0701 - 06/14 0700 In: -  Out: 400 [Urine:400] No intake/output data recorded.  General: NAD Pulmonary: no increased work of breathing Abdomen: non-distended, non-tender, fundus firm at level of umbilicus Inc: Clean/dry/intact, bandage in place Extremities: no edema, no erythema, no tenderness  Labs:  Recent Labs  Lab 09/27/20 1606 09/29/20 0602 09/30/20 0428  WBC 11.4* 14.5* 18.2*  HGB 12.5 12.9 10.5*  HCT 37.9 37.7 30.3*  PLT 281 211 192     Assessment:   34 y.o. M5H8469 postoperative day # 3 status post primary cesarean section for preeclampsia with severe features.   Plan:  1) Acute blood loss anemia - hemodynamically stable and asymptomatic - po ferrous sulfate  2) AB POS / Rubella 14.50 (12/16 1637)/ Varicella Immune  3) Preeclampsia with severe features: blood pressure still not optimal. Continue to monitor with low threshold to increase dose of labetalol.   4) postop status: still requiring frequent medication. Discussed pain control and way of taking medication. She will quickly run out  of pain medication at home taking her current dosing.  Will continue to monitor and try to wean to a more sustainable dosing.   5) TDAP status 08/08/20   6) Disposition: possibly home this evening versus tomorrow.   Conard Novak, MD, FACOG 10/01/2020 11:57 AM

## 2020-10-02 ENCOUNTER — Other Ambulatory Visit: Payer: Self-pay

## 2020-10-02 LAB — SURGICAL PATHOLOGY

## 2020-10-02 MED ORDER — OXYCODONE-ACETAMINOPHEN 5-325 MG PO TABS
1.0000 | ORAL_TABLET | ORAL | 0 refills | Status: DC | PRN
  Filled 2020-10-02 (×2): qty 30, 5d supply, fill #0

## 2020-10-02 MED ORDER — ENOXAPARIN SODIUM 80 MG/0.8ML IJ SOSY
0.5000 mg/kg | PREFILLED_SYRINGE | INTRAMUSCULAR | 1 refills | Status: DC
  Filled 2020-10-02: qty 30, 42d supply, fill #0

## 2020-10-02 MED ORDER — LABETALOL HCL 200 MG PO TABS
400.0000 mg | ORAL_TABLET | Freq: Two times a day (BID) | ORAL | 3 refills | Status: AC
  Filled 2020-10-02 (×2): qty 120, 30d supply, fill #0

## 2020-10-02 MED ORDER — NIFEDIPINE ER OSMOTIC RELEASE 90 MG PO TB24
90.0000 mg | ORAL_TABLET | Freq: Every day | ORAL | 3 refills | Status: AC
  Filled 2020-10-02 (×2): qty 30, 30d supply, fill #0

## 2020-10-02 MED ORDER — OXYCODONE-ACETAMINOPHEN 5-325 MG PO TABS
1.0000 | ORAL_TABLET | ORAL | Status: DC | PRN
  Administered 2020-10-02: 1 via ORAL
  Filled 2020-10-02 (×2): qty 1

## 2020-10-02 NOTE — Progress Notes (Signed)
Patient discharged home Discharge instructions, prescriptions and follow up appointment given to and reviewed with patient. Patient verbalized understanding. Infant in SCN 

## 2020-10-02 NOTE — Progress Notes (Signed)
Admit Date: 09/27/2020 Today's Date: 10/02/2020  Subjective: Postpartum Day 3: Cesarean Delivery Patient reports tolerating PO, + flatus, and no problems voiding.    Objective: Vital signs in last 24 hours: Temp:  [97.9 F (36.6 C)-99.1 F (37.3 C)] 98.1 F (36.7 C) (06/15 1138) Pulse Rate:  [86-102] 89 (06/15 1138) Resp:  [18-20] 20 (06/15 1138) BP: (128-148)/(82-93) 135/82 (06/15 1138) SpO2:  [94 %-100 %] 99 % (06/15 0935)  Physical Exam:  General: alert, cooperative, and no distress Lochia: appropriate Uterine Fundus: firm Incision: healing well DVT Evaluation: No evidence of DVT seen on physical exam.  Recent Labs    09/30/20 0428  HGB 10.5*  HCT 30.3*    Assessment/Plan: Status post Cesarean section. Doing well postoperatively.  Discharge home with standard precautions and return to clinic in 4-6 weeks. Infant remains in SCN for a few more days, doing well S/p BTL Rx for BP meds, Lovenox, Percocet Activity restrictions and follow up discussed  Letitia Libra 10/02/2020, 12:57 PM

## 2020-10-03 ENCOUNTER — Telehealth: Payer: Self-pay

## 2020-10-09 ENCOUNTER — Ambulatory Visit: Payer: BC Managed Care – PPO | Admitting: Obstetrics and Gynecology

## 2020-10-16 ENCOUNTER — Other Ambulatory Visit: Payer: Self-pay

## 2020-10-16 ENCOUNTER — Ambulatory Visit (INDEPENDENT_AMBULATORY_CARE_PROVIDER_SITE_OTHER): Payer: BC Managed Care – PPO | Admitting: Obstetrics and Gynecology

## 2020-10-16 ENCOUNTER — Encounter: Payer: Self-pay | Admitting: Obstetrics and Gynecology

## 2020-10-16 VITALS — BP 128/72 | Wt 277.8 lb

## 2020-10-16 DIAGNOSIS — Z98891 History of uterine scar from previous surgery: Secondary | ICD-10-CM

## 2020-10-16 NOTE — Progress Notes (Signed)
Postpartum Visit  Chief Complaint:  Chief Complaint  Patient presents with   Postpartum Care    History of Present Illness: Patient is a 35 y.o. U3A4536 presents for postpartum visit.  Date of delivery: 09/29/2020 Type of delivery: cesarean section  Pregnancy or labor problems: Preeclampsua  Breast Feeding:  no Lochia: less flow than a normal period Post partum depression/anxiety noted:  no Edinburgh Post-Partum Depression Score:   7   Date of last PAP: 04/04/2020 ASCUS HPV negative   Any problems since the delivery:  no  Newborn Details:  SINGLETON :  1. Baby Gender:female.  Infant Status: Infant doing well at home with mother.   Review of Systems: Review of Systems  Constitutional:  Negative for chills, fever, malaise/fatigue and weight loss.  HENT:  Negative for congestion, hearing loss and sinus pain.   Eyes:  Negative for blurred vision and double vision.  Respiratory:  Negative for cough, sputum production, shortness of breath and wheezing.   Cardiovascular:  Negative for chest pain, palpitations, orthopnea and leg swelling.  Gastrointestinal:  Negative for abdominal pain, constipation, diarrhea, nausea and vomiting.  Genitourinary:  Negative for dysuria, flank pain, frequency, hematuria and urgency.  Musculoskeletal:  Negative for back pain, falls and joint pain.  Skin:  Negative for itching and rash.  Neurological:  Negative for dizziness and headaches.  Psychiatric/Behavioral:  Negative for depression, substance abuse and suicidal ideas. The patient is not nervous/anxious.     Past Medical History:  Past Medical History:  Diagnosis Date   GERD (gastroesophageal reflux disease)    Hypertension    Seasonal allergies     Past Surgical History:  Past Surgical History:  Procedure Laterality Date   CESAREAN SECTION  09/28/2020   Procedure: CESAREAN SECTION;  Surgeon: Nadara Mustard, MD;  Location: [facility] ORS;  Service: Obstetrics;;   NO PAST SURGERIES       Family History:  Family History  Problem Relation Age of Onset   Diabetes Mother     Social History:  Social History   Socioeconomic History   Marital status: Significant Other    Spouse name: Not on file   Number of children: Not on file   Years of education: Not on file   Highest education level: Not on file  Occupational History   Not on file  Tobacco Use   Smoking status: Former    Pack years: 0.00   Smokeless tobacco: Never  Vaping Use   Vaping Use: Former   Substances: Nicotine, Flavoring  Substance and Sexual Activity   Alcohol use: Not Currently    Comment: occas   Drug use: Not Currently   Sexual activity: Yes    Birth control/protection: Implant    Comment: planning  BTL  Other Topics Concern   Not on file  Social History Narrative   Not on file   Social Determinants of Health   Financial Resource Strain: Not on file  Food Insecurity: Not on file  Transportation Needs: Not on file  Physical Activity: Not on file  Stress: Not on file  Social Connections: Not on file  Intimate Partner Violence: Not on file    Allergies:  No Known Allergies  Medications: Prior to Admission medications   Medication Sig Start Date End Date Taking? Authorizing Provider  Cetirizine HCl 10 MG CAPS Take by mouth.   Yes [provider]  enoxaparin (LOVENOX) 80 MG/0.8ML injection Inject 0.7 mLs (70 mg total) into the skin daily. 10/03/20  Yes  Nadara Mustard, MD  esomeprazole (NEXIUM) 20 MG capsule Take 1 capsule (20 mg total) by mouth daily. 07/25/20  Yes Veal, Katelyn, CNM  labetalol (NORMODYNE) 200 MG tablet Take 2 tablets (400 mg total) by mouth 2 (two) times daily. 10/02/20  Yes Nadara Mustard, MD  NIFEdipine (PROCARDIA XL/NIFEDICAL-XL) 90 MG 24 hr tablet Take 1 tablet (90 mg total) by mouth daily. 10/03/20  Yes Nadara Mustard, MD  oxyCODONE-acetaminophen (PERCOCET/ROXICET) 5-325 MG tablet Take 1 tablet by mouth every 4 (four) hours as needed for moderate  pain. 10/02/20  Yes Nadara Mustard, MD  Prenatal Vit-Fe Fumarate-FA (PREPLUS) 27-1 MG TABS  01/16/20  Yes [provider]    Physical Exam Vitals:  Vitals:   10/16/20 1125  BP: 128/72    Physical Exam Constitutional:      Appearance: Normal appearance. She is well-developed.  HENT:     Head: Normocephalic and atraumatic.  Eyes:     Extraocular Movements: Extraocular movements intact.     Pupils: Pupils are equal, round, and reactive to light.  Neck:     Thyroid: No thyromegaly.  Cardiovascular:     Rate and Rhythm: Normal rate and regular rhythm.     Heart sounds: Normal heart sounds.  Pulmonary:     Effort: Pulmonary effort is normal.     Breath sounds: Normal breath sounds.  Abdominal:     General: Bowel sounds are normal. There is no distension.     Palpations: Abdomen is soft. There is no mass.     Comments: Incision is clean, dry and intact  Musculoskeletal:     Cervical back: Neck supple.  Neurological:     Mental Status: She is alert and oriented to person, place, and time.  Skin:    General: Skin is warm and dry.  Psychiatric:        Behavior: Behavior normal.        Thought Content: Thought content normal.        Judgment: Judgment normal.  Vitals reviewed.    Assessment: 35 y.o. [id number] presenting for 6 week postpartum visit  Plan: Problem List Items Addressed This Visit   None Visit Diagnoses     S/P cesarean section    -  Primary        1) Contraception-  BTL  2)  Pap: ASCCP guidelines and rational discussed.  3 year follow up recommended.  3) Patient underwent screening for postpartum depression with no concerns noted.  4) Continue BP medications.   - Follow up in 4 weeks  Adelene Idler MD, [facility], [facility] 10/16/2020 11:45 AM

## 2020-11-01 ENCOUNTER — Telehealth: Payer: Self-pay

## 2020-11-01 NOTE — Telephone Encounter (Signed)
Patient delivered by c-section going on 5 wks. She started her period on Saturday. She has been bleeding heavy w/clots for the past 3-4 days. Soaking a pad. YH#[phone]

## 2020-11-01 NOTE — Telephone Encounter (Signed)
Spoke w/patient. She reports the pad she is soaking is an oversized pad and she is changing it every hour. Advised on heavy bleeding protocol & needs to report to ED for evaluation.

## 2020-11-13 ENCOUNTER — Other Ambulatory Visit: Payer: Self-pay

## 2020-11-13 ENCOUNTER — Other Ambulatory Visit (HOSPITAL_COMMUNITY)
Admission: RE | Admit: 2020-11-13 | Discharge: 2020-11-13 | Disposition: A | Discharge status: Home / Self Care | Payer: Medicaid Other | Source: Ambulatory Visit | Attending: Obstetrics & Gynecology | Admitting: Obstetrics & Gynecology

## 2020-11-13 ENCOUNTER — Encounter: Payer: Self-pay | Admitting: Obstetrics & Gynecology

## 2020-11-13 ENCOUNTER — Ambulatory Visit (INDEPENDENT_AMBULATORY_CARE_PROVIDER_SITE_OTHER): Payer: Medicaid Other | Admitting: Obstetrics & Gynecology

## 2020-11-13 DIAGNOSIS — Z124 Encounter for screening for malignant neoplasm of cervix: Secondary | ICD-10-CM

## 2020-11-13 NOTE — Progress Notes (Signed)
  OBSTETRICS POSTPARTUM CLINIC PROGRESS NOTE  Subjective:     Brittney Lee is a 35 y.o. [phone] female who presents for a postpartum visit. She is 6 weeks postpartum following a Preterm pregnancy <37 weeks or Pregnancy complicated by: Obesity and preeclampsia and delivery by C-section.  I have fully reviewed the prenatal and intrapartum course. Anesthesia: spinal.  Postpartum course has been complicated by uncomplicated.  Baby is feeding by Bottle.  Bleeding: patient has not  resumed menses.  Bowel function is normal. Bladder function is normal.  Patient is not sexually active. Contraception method desired is tubal ligation.  Postpartum depression screening: negative. Edinburgh 4.  The following portions of the patient's history were reviewed and updated as appropriate: allergies, current medications, past family history, past medical history, past social history, past surgical history, and problem list.  Review of Systems Pertinent items are noted in HPI.  Objective:    BP 130/80   Ht 5\' 7"  (1.702 m)   Wt 276 lb (125.2 kg)   LMP 10/26/2020   BMI 43.23 kg/m   General:  alert and no distress   Breasts:  inspection negative, no nipple discharge or bleeding, no masses or nodularity palpable  Lungs: clear to auscultation bilaterally  Heart:  regular rate and rhythm, S1, S2 normal, no murmur, click, rub or gallop  Abdomen: soft, non-tender; bowel sounds normal; no masses,  no organomegaly.  Well healed Pfannenstiel incision   Vulva:  normal  Vagina: normal vagina, no discharge, exudate, lesion, or erythema  Cervix:  no cervical motion tenderness and no lesions  Corpus: normal size, contour, position, consistency, mobility, non-tender  Adnexa:  normal adnexa and no mass, fullness, tenderness  Rectal Exam: Not performed.          Assessment:  Post Partum Care visit 1. Screening for malignant neoplasm of cervix - Cytology - PAP  2. Postpartum care and examination Plan to  taper off antihypertensive meds over the next month Monitor for s/sx HTN Was not on meds prior to pregnancy  Plan:  See orders and Patient Instructions Follow up in:  12  months or as needed.   12/27/2020, MD, Annamarie Major Ob/Gyn, [facility] 11/13/2020  4:44 PM

## 2020-11-18 LAB — CYTOLOGY - PAP
Adequacy: ABSENT
Comment: NEGATIVE
Diagnosis: NEGATIVE
High risk HPV: NEGATIVE

## 2022-08-19 NOTE — Telephone Encounter (Signed)
Error

## 2024-02-10 ENCOUNTER — Other Ambulatory Visit: Payer: Self-pay
